# Patient Record
Sex: Male | Born: 1950
Health system: Southern US, Community
[De-identification: ages and names within clinical notes are randomized; demographics above are authoritative.]

## PROBLEM LIST (undated history)

## (undated) DIAGNOSIS — F1921 Other psychoactive substance dependence, in remission: Secondary | ICD-10-CM

## (undated) DIAGNOSIS — I10 Essential (primary) hypertension: Secondary | ICD-10-CM

## (undated) DIAGNOSIS — N185 Chronic kidney disease, stage 5: Secondary | ICD-10-CM

## (undated) DIAGNOSIS — E119 Type 2 diabetes mellitus without complications: Secondary | ICD-10-CM

## (undated) DIAGNOSIS — I639 Cerebral infarction, unspecified: Secondary | ICD-10-CM

## (undated) DIAGNOSIS — I509 Heart failure, unspecified: Secondary | ICD-10-CM

## (undated) DIAGNOSIS — W3400XA Accidental discharge from unspecified firearms or gun, initial encounter: Secondary | ICD-10-CM

## (undated) DIAGNOSIS — N289 Disorder of kidney and ureter, unspecified: Secondary | ICD-10-CM

## (undated) HISTORY — PX: ABDOMINAL SURGERY: SHX537

## (undated) HISTORY — PX: CEREBRAL ANEURYSM REPAIR: SHX164

## (undated) HISTORY — PX: OTHER SURGICAL HISTORY: SHX169

---

## 1999-05-29 DIAGNOSIS — F1921 Other psychoactive substance dependence, in remission: Secondary | ICD-10-CM

## 1999-05-29 HISTORY — DX: Other psychoactive substance dependence, in remission: F19.21

## 2014-07-06 ENCOUNTER — Emergency Department (HOSPITAL_BASED_OUTPATIENT_CLINIC_OR_DEPARTMENT_OTHER)
Admission: EM | Admit: 2014-07-06 | Discharge: 2014-07-06 | Disposition: A | Discharge status: Home / Self Care | Payer: Self-pay | Attending: Emergency Medicine | Admitting: Emergency Medicine

## 2014-07-06 ENCOUNTER — Encounter (HOSPITAL_BASED_OUTPATIENT_CLINIC_OR_DEPARTMENT_OTHER): Payer: Self-pay | Admitting: *Deleted

## 2014-07-06 DIAGNOSIS — Z87828 Personal history of other (healed) physical injury and trauma: Secondary | ICD-10-CM | POA: Insufficient documentation

## 2014-07-06 DIAGNOSIS — I10 Essential (primary) hypertension: Secondary | ICD-10-CM | POA: Insufficient documentation

## 2014-07-06 DIAGNOSIS — Z720 Tobacco use: Secondary | ICD-10-CM | POA: Insufficient documentation

## 2014-07-06 DIAGNOSIS — R040 Epistaxis: Secondary | ICD-10-CM | POA: Insufficient documentation

## 2014-07-06 HISTORY — DX: Essential (primary) hypertension: I10

## 2014-07-06 HISTORY — DX: Accidental discharge from unspecified firearms or gun, initial encounter: W34.00XA

## 2014-07-06 LAB — BASIC METABOLIC PANEL
Anion gap: 4 — ABNORMAL LOW (ref 5–15)
BUN: 18 mg/dL (ref 6–23)
CO2: 27 mmol/L (ref 19–32)
Calcium: 8.7 mg/dL (ref 8.4–10.5)
Chloride: 105 mmol/L (ref 96–112)
Creatinine, Ser: 1.06 mg/dL (ref 0.50–1.35)
GFR calc Af Amer: 85 mL/min — ABNORMAL LOW (ref >90)
GFR calc non Af Amer: 73 mL/min — ABNORMAL LOW (ref >90)
Glucose, Bld: 94 mg/dL (ref 70–99)
Potassium: 3.7 mmol/L (ref 3.5–5.1)
Sodium: 136 mmol/L (ref 135–145)

## 2014-07-06 MED ORDER — CLONIDINE HCL 0.1 MG PO TABS
0.1000 mg | ORAL_TABLET | Freq: Every day | ORAL | Status: DC
  Administered 2014-07-06: 0.1 mg via ORAL
  Filled 2014-07-06: qty 1

## 2014-07-06 MED ORDER — ATENOLOL 25 MG PO TABS: 25.0000 mg | ORAL_TABLET | Freq: Every day | ORAL | Status: DC

## 2014-07-06 MED ORDER — LISINOPRIL 20 MG PO TABS: 20.0000 mg | ORAL_TABLET | Freq: Every day | ORAL | Status: DC
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
## 2014-07-06 NOTE — ED Notes (Addendum)
Pt c/o nose bleed x 12 hrs no bleeding noted at this time. Denies h/a states he is not taking his  BP med

## 2014-07-06 NOTE — Discharge Instructions (Signed)
Nosebleed Nosebleeds can be caused by many conditions, including trauma, infections, polyps, foreign bodies, dry mucous membranes or climate, medicines, and air conditioning. Most nosebleeds occur in the front of the nose. Because of this location, most nosebleeds can be controlled by pinching the nostrils gently and continuously for at least 10 to 20 minutes. The long, continuous pressure allows enough time for the blood to clot. If pressure is released during that 10 to 20 minute time period, the process may have to be started again. The nosebleed may stop by itself or quit with pressure, or it may need concentrated heating (cautery) or pressure from packing. HOME CARE INSTRUCTIONS   If your nose was packed, try to maintain the pack inside until your health care provider removes it. If a gauze pack was used and it starts to fall out, gently replace it or cut the end off. Do not cut if a balloon catheter was used to pack the nose. Otherwise, do not remove unless instructed.  Avoid blowing your nose for 12 hours after treatment. This could dislodge the pack or clot and start the bleeding again.  If the bleeding starts again, sit up and bend forward, gently pinching the front half of your nose continuously for 20 minutes.  If bleeding was caused by dry mucous membranes, use over-the-counter saline nasal spray or gel. This will keep the mucous membranes moist and allow them to heal. If you must use a lubricant, choose the water-soluble variety. Use it only sparingly and not within several hours of lying down.  Do not use petroleum jelly or mineral oil, as these may drip into the lungs and cause serious problems.  Maintain humidity in your home by using less air conditioning or by using a humidifier.  Do not use aspirin or medicines which make bleeding more likely. Your health care provider can give you recommendations on this.  Resume normal activities as you are able, but try to avoid straining,  lifting, or bending at the waist for several days.  If the nosebleeds become recurrent and the cause is unknown, your health care provider may suggest laboratory tests. SEEK MEDICAL CARE IF: You have a fever. SEEK IMMEDIATE MEDICAL CARE IF:   Bleeding recurs and cannot be controlled.  There is unusual bleeding from or bruising on other parts of the body.  Nosebleeds continue.  There is any worsening of the condition which originally brought you in.  You become light-headed, feel faint, become sweaty, or vomit blood. MAKE SURE YOU:   Understand these instructions.  Will watch your condition.  Will get help right away if you are not doing well or get worse. Document Released: 02/21/2005 Document Revised: 09/28/2013 Document Reviewed: 04/14/2009 [facility] Patient Information 2015 [place], [place]. This information is not intended to replace advice given to you by your health care provider. Make sure you discuss any questions you have with your health care provider. Hypertension Hypertension, commonly called high blood pressure, is when the force of blood pumping through your arteries is too strong. Your arteries are the blood vessels that carry blood from your heart throughout your body. A blood pressure reading consists of a higher number over a lower number, such as 110/72. The higher number (systolic) is the pressure inside your arteries when your heart pumps. The lower number (diastolic) is the pressure inside your arteries when your heart relaxes. Ideally you want your blood pressure below 120/80. Hypertension forces your heart to work harder to pump blood. Your arteries may become  narrow or stiff. Having hypertension puts you at risk for heart disease, stroke, and other problems.  RISK FACTORS Some risk factors for high blood pressure are controllable. Others are not.  Risk factors you cannot control include:   Race. You may be at higher risk if you are African American.  Age. Risk  increases with age.  Gender. Men are at higher risk than women before age 37 years. After age 55, women are at higher risk than men. Risk factors you can control include:  Not getting enough exercise or physical activity.  Being overweight.  Getting too much fat, sugar, calories, or salt in your diet.  Drinking too much alcohol. SIGNS AND SYMPTOMS Hypertension does not usually cause signs or symptoms. Extremely high blood pressure (hypertensive crisis) may cause headache, anxiety, shortness of breath, and nosebleed. DIAGNOSIS  To check if you have hypertension, your health care provider will measure your blood pressure while you are seated, with your arm held at the level of your heart. It should be measured at least twice using the same arm. Certain conditions can cause a difference in blood pressure between your right and left arms. A blood pressure reading that is higher than normal on one occasion does not mean that you need treatment. If one blood pressure reading is high, ask your health care provider about having it checked again. TREATMENT  Treating high blood pressure includes making lifestyle changes and possibly taking medicine. Living a healthy lifestyle can help lower high blood pressure. You may need to change some of your habits. Lifestyle changes may include:  Following the DASH diet. This diet is high in fruits, vegetables, and whole grains. It is low in salt, red meat, and added sugars.  Getting at least 2 hours of brisk physical activity every week.  Losing weight if necessary.  Not smoking.  Limiting alcoholic beverages.  Learning ways to reduce stress. If lifestyle changes are not enough to get your blood pressure under control, your health care provider may prescribe medicine. You may need to take more than one. Work closely with your health care provider to understand the risks and benefits. HOME CARE INSTRUCTIONS  Have your blood pressure rechecked as  directed by your health care provider.   Take medicines only as directed by your health care provider. Follow the directions carefully. Blood pressure medicines must be taken as prescribed. The medicine does not work as well when you skip doses. Skipping doses also puts you at risk for problems.   Do not smoke.   Monitor your blood pressure at home as directed by your health care provider. SEEK MEDICAL CARE IF:   You think you are having a reaction to medicines taken.  You have recurrent headaches or feel dizzy.  You have swelling in your ankles.  You have trouble with your vision. SEEK IMMEDIATE MEDICAL CARE IF:  You develop a severe headache or confusion.  You have unusual weakness, numbness, or feel faint.  You have severe chest or abdominal pain.  You vomit repeatedly.  You have trouble breathing. MAKE SURE YOU:   Understand these instructions.  Will watch your condition.  Will get help right away if you are not doing well or get worse. Document Released: 05/14/2005 Document Revised: 09/28/2013 Document Reviewed: 03/06/2013 [facility] Patient Information 2015 [place], [place]. This information is not intended to replace advice given to you by your health care provider. Make sure you discuss any questions you have with your health care provider.

## 2016-01-07 ENCOUNTER — Emergency Department (HOSPITAL_BASED_OUTPATIENT_CLINIC_OR_DEPARTMENT_OTHER)
Admission: EM | Admit: 2016-01-07 | Discharge: 2016-01-07 | Disposition: A | Discharge status: Home / Self Care | Payer: No Typology Code available for payment source | Attending: Emergency Medicine | Admitting: Emergency Medicine

## 2016-01-07 ENCOUNTER — Encounter (HOSPITAL_BASED_OUTPATIENT_CLINIC_OR_DEPARTMENT_OTHER): Payer: Self-pay | Admitting: *Deleted

## 2016-01-07 DIAGNOSIS — I10 Essential (primary) hypertension: Secondary | ICD-10-CM | POA: Diagnosis not present

## 2016-01-07 DIAGNOSIS — Y939 Activity, unspecified: Secondary | ICD-10-CM | POA: Insufficient documentation

## 2016-01-07 DIAGNOSIS — F17210 Nicotine dependence, cigarettes, uncomplicated: Secondary | ICD-10-CM | POA: Insufficient documentation

## 2016-01-07 DIAGNOSIS — S39012A Strain of muscle, fascia and tendon of lower back, initial encounter: Secondary | ICD-10-CM | POA: Insufficient documentation

## 2016-01-07 DIAGNOSIS — Y999 Unspecified external cause status: Secondary | ICD-10-CM | POA: Insufficient documentation

## 2016-01-07 DIAGNOSIS — Y92410 Unspecified street and highway as the place of occurrence of the external cause: Secondary | ICD-10-CM | POA: Insufficient documentation

## 2016-01-07 DIAGNOSIS — T148XXA Other injury of unspecified body region, initial encounter: Secondary | ICD-10-CM

## 2016-01-07 DIAGNOSIS — M545 Low back pain: Secondary | ICD-10-CM | POA: Diagnosis present

## 2016-01-07 HISTORY — DX: Other psychoactive substance dependence, in remission: F19.21

## 2016-01-07 MED ORDER — METHOCARBAMOL 500 MG PO TABS: 500.0000 mg | ORAL_TABLET | Freq: Two times a day (BID) | ORAL | 0 refills | Status: DC

## 2016-01-07 MED ORDER — AMLODIPINE BESYLATE 5 MG PO TABS: 5.0000 mg | ORAL_TABLET | Freq: Every day | ORAL | 0 refills | Status: DC

## 2016-01-07 MED ORDER — IBUPROFEN 400 MG PO TABS
400.0000 mg | ORAL_TABLET | Freq: Once | ORAL | Status: AC
  Administered 2016-01-07: 400 mg via ORAL
  Filled 2016-01-07: qty 1

## 2016-01-07 MED ORDER — AMLODIPINE BESYLATE 5 MG PO TABS
10.0000 mg | ORAL_TABLET | Freq: Once | ORAL | Status: AC
  Administered 2016-01-07: 10 mg via ORAL
  Filled 2016-01-07: qty 2

## 2016-01-07 MED ORDER — METHOCARBAMOL 500 MG PO TABS
1000.0000 mg | ORAL_TABLET | Freq: Once | ORAL | Status: AC
Start: 2016-01-07 — End: 2016-01-07
  Administered 2016-01-07: 1000 mg via ORAL
  Filled 2016-01-07: qty 2
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
## 2016-01-07 NOTE — Discharge Instructions (Signed)
You can also take  tylenol (acetaminophen) 975mg  (this is 3 over the counter pills) four times a day. Do not drink alcohol or combine with other medications that have acetaminophen as an ingredient (Read the labels!).    For breakthrough pain you may take Robaxin. Do not drink alcohol, drive or operate heavy machinery when taking Robaxin.  Do not hesitate to return to the emergency room for any new, worsening or concerning symptoms.  Please obtain primary care using resource guide below. Let them know that you were seen in the emergency room and that they will need to obtain records for further outpatient management.
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
## 2016-01-07 NOTE — ED Notes (Signed)
Went to discharge pt and pt wasn't in his room. Checked waiting room and parking lot and didn't see pt.

## 2016-01-07 NOTE — ED Notes (Signed)
PA-C at bedside 

## 2016-01-07 NOTE — ED Notes (Signed)
Attempted to call pt -- number on file isn't currently in service.

## 2019-05-18 ENCOUNTER — Emergency Department (HOSPITAL_BASED_OUTPATIENT_CLINIC_OR_DEPARTMENT_OTHER): Payer: Medicare Other

## 2019-05-18 ENCOUNTER — Encounter (HOSPITAL_BASED_OUTPATIENT_CLINIC_OR_DEPARTMENT_OTHER): Payer: Self-pay | Admitting: Emergency Medicine

## 2019-05-18 ENCOUNTER — Other Ambulatory Visit: Payer: Self-pay

## 2019-05-18 ENCOUNTER — Emergency Department (HOSPITAL_BASED_OUTPATIENT_CLINIC_OR_DEPARTMENT_OTHER)
Admission: EM | Admit: 2019-05-18 | Discharge: 2019-05-18 | Disposition: A | Discharge status: Home / Self Care | Payer: Medicare Other | Attending: Emergency Medicine | Admitting: Emergency Medicine

## 2019-05-18 DIAGNOSIS — U071 COVID-19: Secondary | ICD-10-CM | POA: Insufficient documentation

## 2019-05-18 DIAGNOSIS — I129 Hypertensive chronic kidney disease with stage 1 through stage 4 chronic kidney disease, or unspecified chronic kidney disease: Secondary | ICD-10-CM | POA: Diagnosis not present

## 2019-05-18 DIAGNOSIS — Z79899 Other long term (current) drug therapy: Secondary | ICD-10-CM | POA: Diagnosis not present

## 2019-05-18 DIAGNOSIS — F17210 Nicotine dependence, cigarettes, uncomplicated: Secondary | ICD-10-CM | POA: Diagnosis not present

## 2019-05-18 DIAGNOSIS — R0602 Shortness of breath: Secondary | ICD-10-CM

## 2019-05-18 DIAGNOSIS — A09 Infectious gastroenteritis and colitis, unspecified: Secondary | ICD-10-CM

## 2019-05-18 DIAGNOSIS — N189 Chronic kidney disease, unspecified: Secondary | ICD-10-CM | POA: Diagnosis not present

## 2019-05-18 LAB — URINALYSIS, ROUTINE W REFLEX MICROSCOPIC
Glucose, UA: NEGATIVE mg/dL
Ketones, ur: NEGATIVE mg/dL
Leukocytes,Ua: NEGATIVE
Nitrite: NEGATIVE
Protein, ur: 100 mg/dL — AB
Specific Gravity, Urine: 1.02 (ref 1.005–1.030)
pH: 6 (ref 5.0–8.0)

## 2019-05-18 LAB — CBC WITH DIFFERENTIAL/PLATELET
Abs Immature Granulocytes: 0.13 10*3/uL — ABNORMAL HIGH (ref 0.00–0.07)
Basophils Absolute: 0 10*3/uL (ref 0.0–0.1)
Basophils Relative: 0 %
Eosinophils Absolute: 0 10*3/uL (ref 0.0–0.5)
Eosinophils Relative: 0 %
HCT: 45.5 % (ref 39.0–52.0)
Hemoglobin: 14.6 g/dL (ref 13.0–17.0)
Immature Granulocytes: 1 %
Lymphocytes Relative: 18 %
Lymphs Abs: 1.8 10*3/uL (ref 0.7–4.0)
MCH: 31.9 pg (ref 26.0–34.0)
MCHC: 32.1 g/dL (ref 30.0–36.0)
MCV: 99.3 fL (ref 80.0–100.0)
Monocytes Absolute: 0.7 10*3/uL (ref 0.1–1.0)
Monocytes Relative: 7 %
Neutro Abs: 7.4 10*3/uL (ref 1.7–7.7)
Neutrophils Relative %: 74 %
Platelets: 239 10*3/uL (ref 150–400)
RBC: 4.58 MIL/uL (ref 4.22–5.81)
RDW: 11.8 % (ref 11.5–15.5)
WBC: 10 10*3/uL (ref 4.0–10.5)
nRBC: 0 % (ref 0.0–0.2)

## 2019-05-18 LAB — COMPREHENSIVE METABOLIC PANEL
ALT: 30 U/L (ref 0–44)
AST: 41 U/L (ref 15–41)
Albumin: 3.5 g/dL (ref 3.5–5.0)
Alkaline Phosphatase: 77 U/L (ref 38–126)
Anion gap: 10 (ref 5–15)
BUN: 43 mg/dL — ABNORMAL HIGH (ref 8–23)
CO2: 23 mmol/L (ref 22–32)
Calcium: 9.4 mg/dL (ref 8.9–10.3)
Chloride: 111 mmol/L (ref 98–111)
Creatinine, Ser: 2.93 mg/dL — ABNORMAL HIGH (ref 0.61–1.24)
GFR calc Af Amer: 24 mL/min — ABNORMAL LOW (ref >60)
GFR calc non Af Amer: 21 mL/min — ABNORMAL LOW (ref >60)
Glucose, Bld: 102 mg/dL — ABNORMAL HIGH (ref 70–99)
Potassium: 4.3 mmol/L (ref 3.5–5.1)
Sodium: 144 mmol/L (ref 135–145)
Total Bilirubin: 2.3 mg/dL — ABNORMAL HIGH (ref 0.3–1.2)
Total Protein: 8 g/dL (ref 6.5–8.1)

## 2019-05-18 LAB — URINALYSIS, MICROSCOPIC (REFLEX)

## 2019-05-18 MED ORDER — BENZONATATE 100 MG PO CAPS: 100.0000 mg | ORAL_CAPSULE | Freq: Three times a day (TID) | ORAL | 0 refills | Status: DC

## 2019-05-18 MED ORDER — SODIUM CHLORIDE 0.9 % IV BOLUS
500.0000 mL | Freq: Once | INTRAVENOUS | Status: AC
  Administered 2019-05-18: 500 mL via INTRAVENOUS

## 2019-05-18 MED FILL — BENZONATATE 100 MG CAP: 100 | 7 days supply | Qty: 21 | Fill #0

## 2019-05-18 NOTE — ED Triage Notes (Signed)
Pt seen at [facility] on 12/18 for sore throat and sob.  Pt states he still is sob.  Pt denies fever.  sats on ra 97-98%  No acute resp distress noted.
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
## 2019-05-20 LAB — URINE CULTURE: Culture: 30000 — AB

## 2019-05-21 ENCOUNTER — Telehealth: Payer: Self-pay

## 2019-05-21 NOTE — Telephone Encounter (Deleted)
[facility]  Ed 12/19/220 report faxed to [facility] [place] [phone] (fax number)

## 2019-05-21 NOTE — Progress Notes (Signed)
ED Antimicrobial Stewardship Positive Culture Follow Up   Samuel [name] is an 68 y.o. male who presented to [facility] on 05/18/2019 with a chief complaint of  Chief Complaint  Patient presents with  . Shortness of Breath    positive covid 05/15/19    Recent Results (from the past 720 hour(s))  Urine culture     Status: Abnormal   Collection Time: 05/18/19 12:45 PM   Specimen: Urine, Clean Catch  Result Value Ref Range Status   Specimen Description   Final    URINE, CLEAN CATCH Performed at [facility], [address] [place], [place] [address]    Special Requests   Final    NONE Performed at [facility], [address] [place], [place] [address]    Culture 30,000 COLONIES/mL ENTEROBACTER AEROGENES (A)  Final   Report Status 05/20/2019 FINAL  Final   Organism ID, Bacteria ENTEROBACTER AEROGENES (A)  Final      Susceptibility   Enterobacter aerogenes - MIC*    CEFAZOLIN RESISTANT Resistant     CEFTRIAXONE <=1 SENSITIVE Sensitive     CIPROFLOXACIN <=0.25 SENSITIVE Sensitive     GENTAMICIN <=1 SENSITIVE Sensitive     IMIPENEM <=0.25 SENSITIVE Sensitive     NITROFURANTOIN <=16 SENSITIVE Sensitive     TRIMETH/SULFA <=20 SENSITIVE Sensitive     PIP/TAZO <=4 SENSITIVE Sensitive     * 30,000 COLONIES/mL ENTEROBACTER AEROGENES    No wellness check or treatment given no urinary symptoms and negative UA (neg nitrite/leukocytes, WBC 0-5).  ED Provider: Pati Gallo, PA   [facility] 05/21/2019, 9:32 AM Clinical Pharmacist Monday - Friday phone -  [phone] Saturday - Sunday phone - [phone]

## 2019-05-21 NOTE — Telephone Encounter (Signed)
Post ED Visit - Positive Culture Follow-up: Unsuccessful Patient Follow-up  Culture assessed and recommendations reviewed by:  []  Elenor Quinones, Pharm.D. []  Heide Guile, Pharm.D., BCPS AQ-ID []  Parks Neptune, Pharm.D., BCPS []  Alycia Rossetti, Pharm.D., BCPS []  [place], Pharm.D., BCPS, AAHIVP []  Legrand Como, Pharm.D., BCPS, AAHIVP []  Wynell Balloon, PharmD []  Vincenza Hews, PharmD, BCPS Gorden Harms Pharm D Positive urine culture symptom check []  Patient discharged without antimicrobial prescription and treatment is now indicated []  Organism is resistant to prescribed ED discharge antimicrobial []  Patient with positive blood cultures   Unable to contact patient after 3 attempts, letter will be sent to address on file  Genia Del 05/21/2019, 9:44 AM

## 2020-07-15 ENCOUNTER — Inpatient Hospital Stay (HOSPITAL_BASED_OUTPATIENT_CLINIC_OR_DEPARTMENT_OTHER)
Admission: EM | Admit: 2020-07-15 | Discharge: 2020-07-22 | DRG: 065 | Disposition: A | Discharge status: Skilled Nursing Facility | Payer: Medicare HMO | Attending: Neurology | Admitting: Neurology

## 2020-07-15 ENCOUNTER — Encounter (HOSPITAL_BASED_OUTPATIENT_CLINIC_OR_DEPARTMENT_OTHER): Payer: Self-pay | Admitting: Emergency Medicine

## 2020-07-15 ENCOUNTER — Other Ambulatory Visit: Payer: Self-pay

## 2020-07-15 DIAGNOSIS — I610 Nontraumatic intracerebral hemorrhage in hemisphere, subcortical: Principal | ICD-10-CM | POA: Diagnosis present

## 2020-07-15 DIAGNOSIS — R270 Ataxia, unspecified: Secondary | ICD-10-CM | POA: Diagnosis present

## 2020-07-15 DIAGNOSIS — I459 Conduction disorder, unspecified: Secondary | ICD-10-CM | POA: Diagnosis present

## 2020-07-15 DIAGNOSIS — R29810 Facial weakness: Secondary | ICD-10-CM | POA: Diagnosis present

## 2020-07-15 DIAGNOSIS — R29703 NIHSS score 3: Secondary | ICD-10-CM | POA: Diagnosis present

## 2020-07-15 DIAGNOSIS — I619 Nontraumatic intracerebral hemorrhage, unspecified: Secondary | ICD-10-CM

## 2020-07-15 DIAGNOSIS — Z79899 Other long term (current) drug therapy: Secondary | ICD-10-CM

## 2020-07-15 DIAGNOSIS — N184 Chronic kidney disease, stage 4 (severe): Secondary | ICD-10-CM

## 2020-07-15 DIAGNOSIS — I129 Hypertensive chronic kidney disease with stage 1 through stage 4 chronic kidney disease, or unspecified chronic kidney disease: Secondary | ICD-10-CM | POA: Diagnosis present

## 2020-07-15 DIAGNOSIS — I161 Hypertensive emergency: Secondary | ICD-10-CM | POA: Diagnosis present

## 2020-07-15 DIAGNOSIS — N179 Acute kidney failure, unspecified: Secondary | ICD-10-CM | POA: Diagnosis present

## 2020-07-15 DIAGNOSIS — I169 Hypertensive crisis, unspecified: Secondary | ICD-10-CM

## 2020-07-15 DIAGNOSIS — Z20822 Contact with and (suspected) exposure to covid-19: Secondary | ICD-10-CM | POA: Diagnosis present

## 2020-07-15 DIAGNOSIS — F17210 Nicotine dependence, cigarettes, uncomplicated: Secondary | ICD-10-CM | POA: Diagnosis present

## 2020-07-15 DIAGNOSIS — G8194 Hemiplegia, unspecified affecting left nondominant side: Secondary | ICD-10-CM | POA: Diagnosis present

## 2020-07-15 NOTE — ED Triage Notes (Signed)
Pt c/o left sided weakness onset when awaking from nap at 1800.

## 2020-07-16 ENCOUNTER — Encounter (HOSPITAL_BASED_OUTPATIENT_CLINIC_OR_DEPARTMENT_OTHER): Payer: Self-pay | Admitting: Emergency Medicine

## 2020-07-16 ENCOUNTER — Inpatient Hospital Stay (HOSPITAL_COMMUNITY): Payer: Medicare HMO

## 2020-07-16 ENCOUNTER — Emergency Department (HOSPITAL_BASED_OUTPATIENT_CLINIC_OR_DEPARTMENT_OTHER): Payer: Medicare HMO

## 2020-07-16 DIAGNOSIS — I161 Hypertensive emergency: Secondary | ICD-10-CM | POA: Diagnosis present

## 2020-07-16 DIAGNOSIS — N184 Chronic kidney disease, stage 4 (severe): Secondary | ICD-10-CM

## 2020-07-16 DIAGNOSIS — F17200 Nicotine dependence, unspecified, uncomplicated: Secondary | ICD-10-CM | POA: Diagnosis not present

## 2020-07-16 DIAGNOSIS — I610 Nontraumatic intracerebral hemorrhage in hemisphere, subcortical: Secondary | ICD-10-CM | POA: Diagnosis present

## 2020-07-16 DIAGNOSIS — I6389 Other cerebral infarction: Secondary | ICD-10-CM

## 2020-07-16 DIAGNOSIS — I459 Conduction disorder, unspecified: Secondary | ICD-10-CM | POA: Diagnosis present

## 2020-07-16 DIAGNOSIS — R29703 NIHSS score 3: Secondary | ICD-10-CM | POA: Diagnosis present

## 2020-07-16 DIAGNOSIS — N179 Acute kidney failure, unspecified: Secondary | ICD-10-CM | POA: Diagnosis present

## 2020-07-16 DIAGNOSIS — G8194 Hemiplegia, unspecified affecting left nondominant side: Secondary | ICD-10-CM | POA: Diagnosis present

## 2020-07-16 DIAGNOSIS — I169 Hypertensive crisis, unspecified: Secondary | ICD-10-CM

## 2020-07-16 DIAGNOSIS — I129 Hypertensive chronic kidney disease with stage 1 through stage 4 chronic kidney disease, or unspecified chronic kidney disease: Secondary | ICD-10-CM | POA: Diagnosis present

## 2020-07-16 DIAGNOSIS — F17210 Nicotine dependence, cigarettes, uncomplicated: Secondary | ICD-10-CM | POA: Diagnosis present

## 2020-07-16 DIAGNOSIS — Z20822 Contact with and (suspected) exposure to covid-19: Secondary | ICD-10-CM | POA: Diagnosis present

## 2020-07-16 DIAGNOSIS — R270 Ataxia, unspecified: Secondary | ICD-10-CM | POA: Diagnosis present

## 2020-07-16 DIAGNOSIS — Z79899 Other long term (current) drug therapy: Secondary | ICD-10-CM | POA: Diagnosis not present

## 2020-07-16 DIAGNOSIS — I619 Nontraumatic intracerebral hemorrhage, unspecified: Secondary | ICD-10-CM | POA: Diagnosis present

## 2020-07-16 DIAGNOSIS — R29810 Facial weakness: Secondary | ICD-10-CM | POA: Diagnosis present

## 2020-07-16 LAB — DIFFERENTIAL
Abs Immature Granulocytes: 0.02 10*3/uL (ref 0.00–0.07)
Basophils Absolute: 0 10*3/uL (ref 0.0–0.1)
Basophils Relative: 1 %
Eosinophils Absolute: 0.3 10*3/uL (ref 0.0–0.5)
Eosinophils Relative: 4 %
Immature Granulocytes: 0 %
Lymphocytes Relative: 39 %
Lymphs Abs: 3.3 10*3/uL (ref 0.7–4.0)
Monocytes Absolute: 0.7 10*3/uL (ref 0.1–1.0)
Monocytes Relative: 8 %
Neutro Abs: 4.2 10*3/uL (ref 1.7–7.7)
Neutrophils Relative %: 48 %

## 2020-07-16 LAB — ECHOCARDIOGRAM COMPLETE
AR max vel: 1.68 cm2
AV Area VTI: 1.82 cm2
AV Area mean vel: 1.7 cm2
AV Mean grad: 8 mmHg
AV Peak grad: 16.2 mmHg
Ao pk vel: 2.01 m/s
Area-P 1/2: 2.66 cm2
Height: 77 in
P 1/2 time: 679 msec
S' Lateral: 3.7 cm
Weight: 3333.36 oz

## 2020-07-16 LAB — URINALYSIS, ROUTINE W REFLEX MICROSCOPIC
Bilirubin Urine: NEGATIVE
Glucose, UA: NEGATIVE mg/dL
Hgb urine dipstick: NEGATIVE
Ketones, ur: NEGATIVE mg/dL
Leukocytes,Ua: NEGATIVE
Nitrite: NEGATIVE
Protein, ur: NEGATIVE mg/dL
Specific Gravity, Urine: 1.015 (ref 1.005–1.030)
pH: 8 (ref 5.0–8.0)

## 2020-07-16 LAB — COMPREHENSIVE METABOLIC PANEL
ALT: 28 U/L (ref 0–44)
AST: 32 U/L (ref 15–41)
Albumin: 3.5 g/dL (ref 3.5–5.0)
Alkaline Phosphatase: 68 U/L (ref 38–126)
Anion gap: 11 (ref 5–15)
BUN: 51 mg/dL — ABNORMAL HIGH (ref 8–23)
CO2: 22 mmol/L (ref 22–32)
Calcium: 8.4 mg/dL — ABNORMAL LOW (ref 8.9–10.3)
Chloride: 109 mmol/L (ref 98–111)
Creatinine, Ser: 4.01 mg/dL — ABNORMAL HIGH (ref 0.61–1.24)
GFR, Estimated: 15 mL/min — ABNORMAL LOW (ref >60)
Glucose, Bld: 95 mg/dL (ref 70–99)
Potassium: 4.1 mmol/L (ref 3.5–5.1)
Sodium: 142 mmol/L (ref 135–145)
Total Bilirubin: 0.7 mg/dL (ref 0.3–1.2)
Total Protein: 7.2 g/dL (ref 6.5–8.1)

## 2020-07-16 LAB — RESP PANEL BY RT-PCR (FLU A&B, COVID) ARPGX2
Influenza A by PCR: NEGATIVE
Influenza B by PCR: NEGATIVE
SARS Coronavirus 2 by RT PCR: NEGATIVE

## 2020-07-16 LAB — CBC
HCT: 37 % — ABNORMAL LOW (ref 39.0–52.0)
Hemoglobin: 12.6 g/dL — ABNORMAL LOW (ref 13.0–17.0)
MCH: 33.3 pg (ref 26.0–34.0)
MCHC: 34.1 g/dL (ref 30.0–36.0)
MCV: 97.9 fL (ref 80.0–100.0)
Platelets: 213 10*3/uL (ref 150–400)
RBC: 3.78 MIL/uL — ABNORMAL LOW (ref 4.22–5.81)
RDW: 12 % (ref 11.5–15.5)
WBC: 8.6 10*3/uL (ref 4.0–10.5)
nRBC: 0 % (ref 0.0–0.2)

## 2020-07-16 LAB — PROTIME-INR
INR: 1 (ref 0.8–1.2)
Prothrombin Time: 12.9 seconds (ref 11.4–15.2)

## 2020-07-16 LAB — RAPID URINE DRUG SCREEN, HOSP PERFORMED
Amphetamines: NOT DETECTED
Barbiturates: NOT DETECTED
Benzodiazepines: NOT DETECTED
Cocaine: NOT DETECTED
Opiates: NOT DETECTED
Tetrahydrocannabinol: NOT DETECTED

## 2020-07-16 LAB — HIV ANTIBODY (ROUTINE TESTING W REFLEX): HIV Screen 4th Generation wRfx: NONREACTIVE

## 2020-07-16 LAB — MRSA PCR SCREENING: MRSA by PCR: NEGATIVE

## 2020-07-16 LAB — APTT: aPTT: 38 seconds — ABNORMAL HIGH (ref 24–36)

## 2020-07-16 LAB — ETHANOL: Alcohol, Ethyl (B): <10 mg/dL (ref <10)

## 2020-07-16 MED ORDER — STROKE: EARLY STAGES OF RECOVERY BOOK
Freq: Once | Status: AC
  Filled 2020-07-16: qty 1

## 2020-07-16 MED ORDER — SODIUM CHLORIDE 0.9 % IV SOLN
INTRAVENOUS | Status: DC | PRN
  Administered 2020-07-16: 250 mL via INTRAVENOUS

## 2020-07-16 MED ORDER — AMLODIPINE BESYLATE 10 MG PO TABS
10.0000 mg | ORAL_TABLET | Freq: Every day | ORAL | Status: DC
  Administered 2020-07-16 – 2020-07-22 (×7): 10 mg via ORAL
  Filled 2020-07-16 (×7): qty 1

## 2020-07-16 MED ORDER — LABETALOL HCL 5 MG/ML IV SOLN
10.0000 mg | INTRAVENOUS | Status: DC | PRN
  Administered 2020-07-16 (×2): 10 mg via INTRAVENOUS
  Filled 2020-07-16 (×2): qty 4

## 2020-07-16 MED ORDER — LABETALOL HCL 100 MG PO TABS
300.0000 mg | ORAL_TABLET | Freq: Two times a day (BID) | ORAL | Status: DC
  Administered 2020-07-16: 300 mg via ORAL
  Filled 2020-07-16: qty 3

## 2020-07-16 MED ORDER — NICARDIPINE HCL IN NACL 20-0.86 MG/200ML-% IV SOLN
0.0000 mg/h | INTRAVENOUS | Status: DC
  Administered 2020-07-16: 10 mg/h via INTRAVENOUS
  Administered 2020-07-16: 11 mg/h via INTRAVENOUS
  Filled 2020-07-16 (×4): qty 200

## 2020-07-16 MED ORDER — ACETAMINOPHEN 325 MG PO TABS: 650.0000 mg | ORAL_TABLET | ORAL | Status: DC | PRN

## 2020-07-16 MED ORDER — PANTOPRAZOLE SODIUM 40 MG PO TBEC
40.0000 mg | DELAYED_RELEASE_TABLET | Freq: Every day | ORAL | Status: DC
  Administered 2020-07-16 – 2020-07-22 (×6): 40 mg via ORAL
  Filled 2020-07-16 (×7): qty 1

## 2020-07-16 MED ORDER — CHLORHEXIDINE GLUCONATE CLOTH 2 % EX PADS
6.0000 | MEDICATED_PAD | Freq: Every day | CUTANEOUS | Status: DC
  Administered 2020-07-16 – 2020-07-19 (×3): 6 via TOPICAL

## 2020-07-16 MED ORDER — PANTOPRAZOLE SODIUM 40 MG IV SOLR: 40.0000 mg | Freq: Every day | INTRAVENOUS | Status: DC

## 2020-07-16 MED ORDER — LABETALOL HCL 5 MG/ML IV SOLN
20.0000 mg | Freq: Once | INTRAVENOUS | Status: AC
  Administered 2020-07-16: 20 mg via INTRAVENOUS
  Filled 2020-07-16: qty 4

## 2020-07-16 MED ORDER — CLEVIDIPINE BUTYRATE 0.5 MG/ML IV EMUL
0.0000 mg/h | INTRAVENOUS | Status: DC
  Administered 2020-07-16: 16 mg/h via INTRAVENOUS
  Administered 2020-07-16: 14 mg/h via INTRAVENOUS
  Administered 2020-07-16: 9 mg/h via INTRAVENOUS
  Administered 2020-07-16: 14 mg/h via INTRAVENOUS
  Administered 2020-07-16: 15 mg/h via INTRAVENOUS
  Administered 2020-07-16: 2 mg/h via INTRAVENOUS
  Filled 2020-07-16: qty 50
  Filled 2020-07-16 (×2): qty 100
  Filled 2020-07-16: qty 50

## 2020-07-16 MED ORDER — HYDRALAZINE HCL 50 MG PO TABS
100.0000 mg | ORAL_TABLET | Freq: Three times a day (TID) | ORAL | Status: DC
Start: 2020-07-16 — End: 2020-07-22
  Administered 2020-07-16 – 2020-07-22 (×18): 100 mg via ORAL
  Filled 2020-07-16 (×20): qty 2

## 2020-07-16 MED ORDER — ACETAMINOPHEN 650 MG RE SUPP: 650.0000 mg | RECTAL | Status: DC | PRN

## 2020-07-16 MED ORDER — ACETAMINOPHEN 160 MG/5ML PO SOLN: 650.0000 mg | ORAL | Status: DC | PRN

## 2020-07-16 MED ORDER — LABETALOL HCL 200 MG PO TABS
300.0000 mg | ORAL_TABLET | Freq: Three times a day (TID) | ORAL | Status: DC
  Administered 2020-07-16 – 2020-07-22 (×18): 300 mg via ORAL
  Filled 2020-07-16 (×2): qty 1
  Filled 2020-07-16: qty 3
  Filled 2020-07-16 (×2): qty 1
  Filled 2020-07-16: qty 3
  Filled 2020-07-16 (×2): qty 1
  Filled 2020-07-16: qty 3
  Filled 2020-07-16 (×4): qty 1
  Filled 2020-07-16: qty 3
  Filled 2020-07-16 (×4): qty 1
  Filled 2020-07-16: qty 3
  Filled 2020-07-16: qty 1

## 2020-07-16 MED ORDER — SENNOSIDES-DOCUSATE SODIUM 8.6-50 MG PO TABS
1.0000 | ORAL_TABLET | Freq: Two times a day (BID) | ORAL | Status: DC
  Administered 2020-07-16 – 2020-07-22 (×6): 1 via ORAL
  Filled 2020-07-16 (×13): qty 1

## 2020-07-16 MED ORDER — NICARDIPINE HCL IN NACL 20-0.86 MG/200ML-% IV SOLN
INTRAVENOUS | Status: AC
  Administered 2020-07-16: 5 mg/h via INTRAVENOUS
  Filled 2020-07-16: qty 200

## 2020-07-16 NOTE — ED Notes (Signed)
Attempted to call report x 4, no answer.

## 2020-07-16 NOTE — Progress Notes (Signed)
PT Cancellation Note  Patient Details Name: Samuel [name] MRN: [id number] DOB: [birth year]   Cancelled Treatment:    Reason Eval/Treat Not Completed: Active bedrest order this morning. PT will continue to follow and evaluate when appropriate.   Karma Ganja, PT, DPT   Acute Rehabilitation Department Pager #: [phone]   Otho Bellows 07/16/2020, 7:35 AM

## 2020-07-16 NOTE — ED Notes (Signed)
pts wife at  The bedside

## 2020-07-16 NOTE — ED Notes (Signed)
Report called to rn on 4n mitchell rn

## 2020-07-16 NOTE — Progress Notes (Signed)
OT Cancellation Note  Patient Details Name: Samuel [name] MRN: [id number] DOB: 03/16/1951   Cancelled Treatment:    Reason Eval/Treat Not Completed: Active bedrest order. Will return as schedule allows.  [place], OTR/L Acute Rehab Pager: [phone] Office: [phone] 07/16/2020, 7:18 AM

## 2020-07-16 NOTE — Progress Notes (Signed)
VASCULAR LAB    Carotid duplex has been performed.  See CV proc for preliminary results.   Virgie Kunda, RVT 07/16/2020, 2:47 PM
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
## 2020-07-16 NOTE — ED Notes (Signed)
Pt transferred from [facility]  Dr Brunilda Payor this pt  His nih 3 on arrival  bp higher after the move from the stretcher than [facility] on the way here  No pain

## 2020-07-16 NOTE — ED Notes (Signed)
The pts bp cuff was removed from the rt  Upper arm because that is the arm  That has the iv med going in.. bp cuff placed onto the lt upper arm
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
## 2020-07-16 NOTE — ED Notes (Signed)
cardene  Drip rate lowered to '14mg'$ 
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
## 2020-07-16 NOTE — ED Notes (Signed)
cardene decreased to '13mg'$  iv

## 2020-07-17 LAB — CBC
HCT: 35.4 % — ABNORMAL LOW (ref 39.0–52.0)
Hemoglobin: 11.7 g/dL — ABNORMAL LOW (ref 13.0–17.0)
MCH: 32.7 pg (ref 26.0–34.0)
MCHC: 33.1 g/dL (ref 30.0–36.0)
MCV: 98.9 fL (ref 80.0–100.0)
Platelets: 198 10*3/uL (ref 150–400)
RBC: 3.58 MIL/uL — ABNORMAL LOW (ref 4.22–5.81)
RDW: 12 % (ref 11.5–15.5)
WBC: 7.8 10*3/uL (ref 4.0–10.5)
nRBC: 0 % (ref 0.0–0.2)

## 2020-07-17 LAB — HEMOGLOBIN A1C
Hgb A1c MFr Bld: 4.6 % — ABNORMAL LOW (ref 4.8–5.6)
Mean Plasma Glucose: 85.32 mg/dL

## 2020-07-17 LAB — BASIC METABOLIC PANEL
Anion gap: 12 (ref 5–15)
BUN: 42 mg/dL — ABNORMAL HIGH (ref 8–23)
CO2: 18 mmol/L — ABNORMAL LOW (ref 22–32)
Calcium: 8.4 mg/dL — ABNORMAL LOW (ref 8.9–10.3)
Chloride: 113 mmol/L — ABNORMAL HIGH (ref 98–111)
Creatinine, Ser: 4.41 mg/dL — ABNORMAL HIGH (ref 0.61–1.24)
GFR, Estimated: 14 mL/min — ABNORMAL LOW (ref >60)
Glucose, Bld: 83 mg/dL (ref 70–99)
Potassium: 3.8 mmol/L (ref 3.5–5.1)
Sodium: 143 mmol/L (ref 135–145)

## 2020-07-17 LAB — LIPID PANEL
Cholesterol: 103 mg/dL (ref 0–200)
HDL: 34 mg/dL — ABNORMAL LOW (ref >40)
LDL Cholesterol: 47 mg/dL (ref 0–99)
Total CHOL/HDL Ratio: 3 RATIO
Triglycerides: 110 mg/dL (ref <150)
VLDL: 22 mg/dL (ref 0–40)

## 2020-07-17 LAB — TRIGLYCERIDES: Triglycerides: 111 mg/dL (ref <150)

## 2020-07-17 MED ORDER — LABETALOL HCL 5 MG/ML IV SOLN
10.0000 mg | INTRAVENOUS | Status: DC | PRN
  Administered 2020-07-19: 10 mg via INTRAVENOUS
  Filled 2020-07-17: qty 4

## 2020-07-17 NOTE — Progress Notes (Signed)
Pt arrived on unit. Pt is oriented to the unit with bed locked at the lowest position with the call bell within reach

## 2020-07-17 NOTE — Progress Notes (Signed)
STROKE TEAM PROGRESS NOTE   SUBJECTIVE (INTERVAL HISTORY) His RN is at the bedside.  Overall his condition is stable. He is off cleviprex this am. SBP 140s, DBP 90-100, on home meds. PT/OT recommend CIR. Still has left hemiparesis with ataxia.    OBJECTIVE Temp:  [98 F (36.7 C)-98.6 F (37 C)] 98.1 F (36.7 C) (02/20 0800) Pulse Rate:  [50-75] 57 (02/20 1000) Cardiac Rhythm: Normal sinus rhythm (02/20 0800) Resp:  [0-24] 13 (02/20 1000) BP: (124-160)/(77-104) 137/98 (02/20 1000) SpO2:  [94 %-99 %] 96 % (02/20 1000)  No results for input(s): GLUCAP in the last 168 hours. Recent Labs  Lab 07/16/20 0008 07/17/20 0420  NA 142 143  K 4.1 3.8  CL 109 113*  CO2 22 18*  GLUCOSE 95 83  BUN 51* 42*  CREATININE 4.01* 4.41*  CALCIUM 8.4* 8.4*   Recent Labs  Lab 07/16/20 0008  AST 32  ALT 28  ALKPHOS 68  BILITOT 0.7  PROT 7.2  ALBUMIN 3.5   Recent Labs  Lab 07/16/20 0008 07/17/20 0420  WBC 8.6 7.8  NEUTROABS 4.2  --   HGB 12.6* 11.7*  HCT 37.0* 35.4*  MCV 97.9 98.9  PLT 213 198   No results for input(s): CKTOTAL, CKMB, CKMBINDEX, TROPONINI in the last 168 hours. Recent Labs    07/16/20 0008  LABPROT 12.9  INR 1.0   Recent Labs    07/16/20 0008  COLORURINE YELLOW  LABSPEC 1.015  PHURINE 8.0  GLUCOSEU NEGATIVE  HGBUR NEGATIVE  BILIRUBINUR NEGATIVE  KETONESUR NEGATIVE  PROTEINUR NEGATIVE  NITRITE NEGATIVE  LEUKOCYTESUR NEGATIVE       Component Value Date/Time   CHOL 103 07/17/2020 0420   TRIG 110 07/17/2020 0420   TRIG 111 07/17/2020 0420   HDL 34 (L) 07/17/2020 0420   CHOLHDL 3.0 07/17/2020 0420   VLDL 22 07/17/2020 0420   LDLCALC 47 07/17/2020 0420   Lab Results  Component Value Date   HGBA1C 4.6 (L) 07/17/2020      Component Value Date/Time   LABOPIA NONE DETECTED 07/16/2020 0008   COCAINSCRNUR NONE DETECTED 07/16/2020 0008   LABBENZ NONE DETECTED 07/16/2020 0008   AMPHETMU NONE DETECTED 07/16/2020 0008   THCU NONE DETECTED 07/16/2020  0008   LABBARB NONE DETECTED 07/16/2020 0008    Recent Labs  Lab 07/16/20 0008  ETH <10    I have personally reviewed the radiological images below and agree with the radiology interpretations.  CT HEAD WO CONTRAST  Result Date: 07/16/2020 CLINICAL DATA:  Follow-up intracranial hemorrhage. EXAM: CT HEAD WITHOUT CONTRAST TECHNIQUE: Contiguous axial images were obtained from the base of the skull through the vertex without intravenous contrast. COMPARISON:  07/16/2020 FINDINGS: Brain: An acute parenchymal hemorrhage involving the posterior right basal ganglia/internal capsule/lateral thalamus has not significantly changed in size, measuring 1.4 x 1.0 x 1.2 cm (estimated volume of 0.8 mL). Mild surrounding edema has slightly increased without mass effect. No acute large territory infarct, new intracranial hemorrhage, midline shift, or extra-axial fluid collection is identified. Mild-to-moderate lateral, third, and fourth ventriculomegaly is unchanged and may reflect a component of underlying chronic communicating hydrocephalus in this patient with a remote history of subarachnoid hemorrhage (versus central predominant cerebral atrophy). Patchy and confluent hypodensities in the cerebral white matter bilaterally are unchanged and nonspecific but compatible with extensive chronic small vessel ischemic disease. Vascular: Aneurysm clip in the anterior communicating region. Skull: Left perianal craniotomy. Sinuses/Orbits: Remote medial right or fracture. Mild mucosal thickening in the paranasal  sinuses. Clear mastoid air cells. Other: None. IMPRESSION: 1. Unchanged size of posterior right basal ganglia region hemorrhage with slightly increased surrounding edema. No mass effect. 2. Extensive chronic small vessel ischemic disease. 3. Unchanged ventriculomegaly. Electronically Signed   By: Logan Bores M.D.   On: 07/16/2020 16:23   ECHOCARDIOGRAM COMPLETE  Result Date: 07/16/2020    ECHOCARDIOGRAM REPORT    Patient Name:   Samuel [name] Date of Exam: 07/16/2020 Medical Rec #:  [id number]       Height:       77.0 in Accession #:    [id number]      Weight:       208.3 lb Date of Birth:  1950/06/04       BSA:          2.276 m Patient Age:    62 years        BP:           134/92 mmHg Patient Gender: M               HR:           67 bpm. Exam Location:  Inpatient Procedure: 2D Echo, Cardiac Doppler and Color Doppler Indications:    Stroke 434.91  History:        Patient has no prior history of Echocardiogram examinations.                 Risk Factors:Hypertension. Drug addiction in remission ([place])                 (From Hx), ICH (intracerebral hemorrhage).  Sonographer:    Alvino Chapel RCS Referring Phys: [phone] MCNEILL P [place]  1. There is severe left ventricular hypertrophy, consider evaluation for infiltrative cardiomyopathies such as cardiac amyloidosis.  2. Left ventricular ejection fraction, by estimation, is 60 to 65%. The left ventricle has normal function. The left ventricle has no regional wall motion abnormalities. There is severe concentric left ventricular hypertrophy. Left ventricular diastolic  parameters are consistent with Grade I diastolic dysfunction (impaired relaxation). Elevated left atrial pressure.  3. Right ventricular systolic function is normal. The right ventricular size is normal.  4. Left atrial size was moderately dilated.  5. The mitral valve is normal in structure. Mild mitral valve regurgitation. No evidence of mitral stenosis.  6. The aortic valve is normal in structure. Aortic valve regurgitation is mild. No aortic stenosis is present.  7. The inferior vena cava is normal in size with greater than 50% respiratory variability, suggesting right atrial pressure of 3 mmHg. FINDINGS  Left Ventricle: Left ventricular ejection fraction, by estimation, is 60 to 65%. The left ventricle has normal function. The left ventricle has no regional wall motion abnormalities. The left  ventricular internal cavity size was normal in size. There is  severe concentric left ventricular hypertrophy. Left ventricular diastolic parameters are consistent with Grade I diastolic dysfunction (impaired relaxation). Elevated left atrial pressure. Right Ventricle: The right ventricular size is normal. No increase in right ventricular wall thickness. Right ventricular systolic function is normal. Left Atrium: Left atrial size was moderately dilated. Right Atrium: Right atrial size was normal in size. Pericardium: There is no evidence of pericardial effusion. Mitral Valve: The mitral valve is normal in structure. Mild mitral valve regurgitation. No evidence of mitral valve stenosis. Tricuspid Valve: The tricuspid valve is normal in structure. Tricuspid valve regurgitation is mild . No evidence of tricuspid stenosis. Aortic Valve: The aortic valve is normal in structure.  Aortic valve regurgitation is mild. Aortic regurgitation PHT measures 679 msec. No aortic stenosis is present. Aortic valve mean gradient measures 8.0 mmHg. Aortic valve peak gradient measures 16.2 mmHg. Aortic valve area, by VTI measures 1.82 cm. Pulmonic Valve: The pulmonic valve was normal in structure. Pulmonic valve regurgitation is not visualized. No evidence of pulmonic stenosis. Aorta: The aortic root is normal in size and structure. Venous: The inferior vena cava is normal in size with greater than 50% respiratory variability, suggesting right atrial pressure of 3 mmHg. IAS/Shunts: No atrial level shunt detected by color flow Doppler.  LEFT VENTRICLE PLAX 2D LVIDd:         5.30 cm  Diastology LVIDs:         3.70 cm  LV e' medial:    5.00 cm/s LV PW:         1.70 cm  LV E/e' medial:  12.4 LV IVS:        1.90 cm  LV e' lateral:   7.29 cm/s LVOT diam:     2.10 cm  LV E/e' lateral: 8.5 LV SV:         73 LV SV Index:   32 LVOT Area:     3.46 cm  RIGHT VENTRICLE RV S prime:     14.00 cm/s TAPSE (M-mode): 2.4 cm LEFT ATRIUM              Index        RIGHT ATRIUM           Index LA diam:        3.50 cm  1.54 cm/m  RA Area:     25.80 cm LA Vol (A2C):   157.0 ml 68.98 ml/m RA Volume:   91.70 ml  40.29 ml/m LA Vol (A4C):   125.0 ml 54.92 ml/m LA Biplane Vol: 145.0 ml 63.71 ml/m  AORTIC VALVE AV Area (Vmax):    1.68 cm AV Area (Vmean):   1.70 cm AV Area (VTI):     1.82 cm AV Vmax:           201.00 cm/s AV Vmean:          124.500 cm/s AV VTI:            0.404 m AV Peak Grad:      16.2 mmHg AV Mean Grad:      8.0 mmHg LVOT Vmax:         97.25 cm/s LVOT Vmean:        61.150 cm/s LVOT VTI:          0.212 m LVOT/AV VTI ratio: 0.53 AI PHT:            679 msec  AORTA Ao Root diam: 4.00 cm MITRAL VALVE MV Area (PHT): 2.66 cm    SHUNTS MV Decel Time: 285 msec    Systemic VTI:  0.21 m MV E velocity: 62.10 cm/s  Systemic Diam: 2.10 cm MV A velocity: 81.40 cm/s MV E/A ratio:  0.76 Ena Dawley MD Electronically signed by Ena Dawley MD Signature Date/Time: 07/16/2020/1:55:02 PM    Final    CT HEAD CODE STROKE WO CONTRAST  Result Date: 07/16/2020 CLINICAL DATA:  Code stroke.  Left-sided weakness EXAM: CT HEAD WITHOUT CONTRAST TECHNIQUE: Contiguous axial images were obtained from the base of the skull through the vertex without intravenous contrast. COMPARISON:  None. FINDINGS: Brain: There is a small intraparenchymal hemorrhage in the right basal ganglia measuring 1.5 x 0.8 cm. There  is diffuse volume loss of the brain periventricular white matter hypoattenuation. Vascular: Aneurysm clip in the expected location of the anterior communicating artery. Skull: Remote left-sided craniotomy. Sinuses/Orbits: No fluid levels or advanced mucosal thickening of the visualized paranasal sinuses. No mastoid or middle ear effusion. The orbits are normal. IMPRESSION: 1. Small intraparenchymal hemorrhage in the right basal ganglia, likely hypertensive. 2. Critical Value/emergent results were called by telephone at the time of interpretation on 07/16/2020 at 12:22 am to  provider [facility] , who verbally acknowledged these results. Electronically Signed   By: Ulyses Jarred M.D.   On: 07/16/2020 00:22   VAS US CAROTID  Result Date: 07/16/2020 Carotid Arterial Duplex Study Indications:       CVA and small hypertensive bleed in the basal ganglia. Risk Factors:      Hypertension. Comparison Study:  No prior study Performing Technologist: Sharion Dove RVS  Examination Guidelines: A complete evaluation includes B-mode imaging, spectral Doppler, color Doppler, and power Doppler as needed of all accessible portions of each vessel. Bilateral testing is considered an integral part of a complete examination. Limited examinations for reoccurring indications may be performed as noted.  Right Carotid Findings: +----------+--------+--------+--------+------------------+------------------+           PSV cm/sEDV cm/sStenosisPlaque DescriptionComments           +----------+--------+--------+--------+------------------+------------------+ CCA Prox  101     11                                intimal thickening +----------+--------+--------+--------+------------------+------------------+ CCA Distal64      10                                intimal thickening +----------+--------+--------+--------+------------------+------------------+ ICA Prox  28      8               heterogenous                         +----------+--------+--------+--------+------------------+------------------+ ICA Distal52      11                                tortuous           +----------+--------+--------+--------+------------------+------------------+ ECA       29      6                                 tortuous           +----------+--------+--------+--------+------------------+------------------+ +----------+--------+-------+--------+-------------------+           PSV cm/sEDV cmsDescribeArm Pressure (mmHG) +----------+--------+-------+--------+-------------------+  Subclavian100                                        +----------+--------+-------+--------+-------------------+ +---------+--------+--+--------+--+ VertebralPSV cm/s58EDV cm/s12 +---------+--------+--+--------+--+  Left Carotid Findings: +----------+--------+--------+--------+------------------+------------------+           PSV cm/sEDV cm/sStenosisPlaque DescriptionComments           +----------+--------+--------+--------+------------------+------------------+ CCA Prox  88      22  intimal thickening +----------+--------+--------+--------+------------------+------------------+ CCA Distal76      24                                intimal thickening +----------+--------+--------+--------+------------------+------------------+ ICA Prox  56      14              heterogenous      tortuous           +----------+--------+--------+--------+------------------+------------------+ ICA Distal33      11                                tortuous           +----------+--------+--------+--------+------------------+------------------+ ECA       53      13                                                   +----------+--------+--------+--------+------------------+------------------+ +----------+--------+--------+--------+-------------------+           PSV cm/sEDV cm/sDescribeArm Pressure (mmHG) +----------+--------+--------+--------+-------------------+ [id number]                                          +----------+--------+--------+--------+-------------------+ +---------+--------+--+--------+-+ VertebralPSV cm/s34EDV cm/s9 +---------+--------+--+--------+-+   Summary: Right Carotid: The extracranial vessels were near-normal with only minimal wall                thickening or plaque. Left Carotid: The extracranial vessels were near-normal with only minimal wall               thickening or plaque. Vertebrals:  Bilateral vertebral  arteries demonstrate antegrade flow. Subclavians: Normal flow hemodynamics were seen in bilateral subclavian              arteries. *See table(s) above for measurements and observations.     Preliminary     PHYSICAL EXAM  Temp:  [98 F (36.7 C)-98.6 F (37 C)] 98.1 F (36.7 C) (02/20 0800) Pulse Rate:  [50-75] 57 (02/20 1000) Resp:  [0-24] 13 (02/20 1000) BP: (124-160)/(77-104) 137/98 (02/20 1000) SpO2:  [94 %-99 %] 96 % (02/20 1000)  General - Well nourished, well developed, in no apparent distress.  Ophthalmologic - fundi not visualized due to noncooperation.  Cardiovascular - Regular rhythm and rate.  Mental Status -  Level of arousal and orientation to time, place, and person were intact. Language including expression, naming, repetition, comprehension was assessed and found intact.  Cranial Nerves II - XII - II - Visual field intact OU. III, IV, VI - Extraocular movements intact. V - Facial sensation intact bilaterally. VII - mild left nasolabial fold flattening. VIII - Hearing & vestibular intact bilaterally. X - Palate elevates symmetrically. XI - Chin turning & shoulder shrug intact bilaterally. XII - Tongue protrusion intact.  Motor Strength - The patient's strength was normal in right UE and LE, but left UE proximal 4/5 and distal 5-/5 with pronator drift, left LE proximal 4/5 with distal 4+/5.  Bulk was normal and fasciculations were absent.   Motor Tone - Muscle tone was assessed at the neck and appendages and was normal.  Reflexes - The patient's reflexes were symmetrical in  all extremities and he had no pathological reflexes.  Sensory - Light touch, temperature/pinprick were assessed and were symmetrical.    Coordination - The patient had normal movements in the right hand and foot with no ataxia or dysmetria. However, severe ataxia left FTN and moderate ataxia left HTS, seems out of proportion to the weakness. Tremor was absent.  Gait and Station -  deferred.   ASSESSMENT/PLAN [name] is a 70 y.o. male with history of hypertension, CKD admitted for left-sided weakness. No tPA given due to [place].    ICH:  right BG small ICH secondary to uncontrolled hypertension  CT head right BG small ICH  Repeat CT head stable  Carotid Doppler unremarkable  2D Echo EF 60 to 65%  LDL 47  HgbA1c 4.6  UDS negative  SCD for VTE prophylaxis  No antithrombotic prior to admission, now on No antithrombotic due to [place]  Ongoing aggressive stroke risk factor management  Therapy recommendations: CIR  Disposition: Pending  Hypertensive emergency . Stable . Off Cleviprex this am . On Norvasc 10, labetalol 300 tid, hydralazine 100 q8 . BP goal less than 160  Long term BP goal normotensive  CKD stage IV  Creatinine 1.06 in 2016, 2.93 in 2020  This admission 4.01->4.41  Avoid nephrotoxic agent  Encourage po intake  BMP monitoring  Tobacco abuse  Current smoker  Smoking cessation counseling provided  Pt is willing to quit  Other Stroke Risk Factors  Advanced age  Other [facility] day # 1   This patient is critically ill due to [place], hypertensive emergency, CKD and at significant risk of neurological worsening, death form hematoma expansion, cerebral edema, hypertensive encephalopathy, renal failure. This patient's care requires constant monitoring of vital signs, hemodynamics, respiratory and cardiac monitoring, review of multiple databases, neurological assessment, discussion with family, other specialists and medical decision making of high complexity. I spent 30 minutes of neurocritical care time in the care of this patient.   Rosalin Hawking, MD PhD Stroke Neurology 07/17/2020 11:32 AM    To contact Stroke Continuity provider, please refer to [email or web address] After hours, contact General Neurology
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
## 2020-07-17 NOTE — Progress Notes (Addendum)
Inpatient Rehab Admissions Coordinator Note:   Per therapy recommendations, pt was screened for CIR candidacy by Clemens Catholic, [place] CCC-SLP. At this time, Pt. Demonstrates need in one discipline only. I will await OT eval and re-screen tomorrow.   Clemens Catholic, [place], [place] Admissions Coordinator  [phone] ([place]) [phone] (office)
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
## 2020-07-18 LAB — CBC
HCT: 34.3 % — ABNORMAL LOW (ref 39.0–52.0)
Hemoglobin: 12 g/dL — ABNORMAL LOW (ref 13.0–17.0)
MCH: 34.3 pg — ABNORMAL HIGH (ref 26.0–34.0)
MCHC: 35 g/dL (ref 30.0–36.0)
MCV: 98 fL (ref 80.0–100.0)
Platelets: 195 10*3/uL (ref 150–400)
RBC: 3.5 MIL/uL — ABNORMAL LOW (ref 4.22–5.81)
RDW: 12.1 % (ref 11.5–15.5)
WBC: 7.5 10*3/uL (ref 4.0–10.5)
nRBC: 0 % (ref 0.0–0.2)

## 2020-07-18 LAB — BASIC METABOLIC PANEL
Anion gap: 13 (ref 5–15)
BUN: 43 mg/dL — ABNORMAL HIGH (ref 8–23)
CO2: 18 mmol/L — ABNORMAL LOW (ref 22–32)
Calcium: 8.5 mg/dL — ABNORMAL LOW (ref 8.9–10.3)
Chloride: 111 mmol/L (ref 98–111)
Creatinine, Ser: 4.58 mg/dL — ABNORMAL HIGH (ref 0.61–1.24)
GFR, Estimated: 13 mL/min — ABNORMAL LOW (ref >60)
Glucose, Bld: 87 mg/dL (ref 70–99)
Potassium: 3.8 mmol/L (ref 3.5–5.1)
Sodium: 142 mmol/L (ref 135–145)
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
## 2020-07-18 NOTE — Progress Notes (Signed)
STROKE TEAM PROGRESS NOTE   SUBJECTIVE (INTERVAL HISTORY) No family is at the bedside. Pt sitting in chair, has been walking with PT/OT today. He stated that he still has mild dragging on the left foot, but he was able to walk in the hallway for long distance. Still has left arm weakness and ataxia. PT/OT recommend CIR but seems high functioning now. CIR coordinator will take a look. Pt lives alone however, if needs to go home he prefer to go to his wife (pt and wife seperated) house. Will check with SW too.    OBJECTIVE Temp:  [97.3 F (36.3 C)-98.4 F (36.9 C)] 97.5 F (36.4 C) (02/21 1500) Pulse Rate:  [54-66] 55 (02/21 1500) Cardiac Rhythm: Sinus bradycardia (02/21 1139) Resp:  [16-18] 18 (02/21 1500) BP: (132-159)/(78-98) 132/78 (02/21 1500) SpO2:  [96 %-100 %] 100 % (02/21 1500)  No results for input(s): GLUCAP in the last 168 hours. Recent Labs  Lab 07/16/20 0008 07/17/20 0420 07/18/20 0257  NA 142 143 142  K 4.1 3.8 3.8  CL 109 113* 111  CO2 22 18* 18*  GLUCOSE 95 83 87  BUN 51* 42* 43*  CREATININE 4.01* 4.41* 4.58*  CALCIUM 8.4* 8.4* 8.5*   Recent Labs  Lab 07/16/20 0008  AST 32  ALT 28  ALKPHOS 68  BILITOT 0.7  PROT 7.2  ALBUMIN 3.5   Recent Labs  Lab 07/16/20 0008 07/17/20 0420 07/18/20 0257  WBC 8.6 7.8 7.5  NEUTROABS 4.2  --   --   HGB 12.6* 11.7* 12.0*  HCT 37.0* 35.4* 34.3*  MCV 97.9 98.9 98.0  PLT 213 198 195   No results for input(s): CKTOTAL, CKMB, CKMBINDEX, TROPONINI in the last 168 hours. Recent Labs    07/16/20 0008  LABPROT 12.9  INR 1.0   Recent Labs    07/16/20 0008  COLORURINE YELLOW  LABSPEC 1.015  PHURINE 8.0  GLUCOSEU NEGATIVE  HGBUR NEGATIVE  BILIRUBINUR NEGATIVE  KETONESUR NEGATIVE  PROTEINUR NEGATIVE  NITRITE NEGATIVE  LEUKOCYTESUR NEGATIVE       Component Value Date/Time   CHOL 103 07/17/2020 0420   TRIG 110 07/17/2020 0420   TRIG 111 07/17/2020 0420   HDL 34 (L) 07/17/2020 0420   CHOLHDL 3.0 07/17/2020  0420   VLDL 22 07/17/2020 0420   LDLCALC 47 07/17/2020 0420   Lab Results  Component Value Date   HGBA1C 4.6 (L) 07/17/2020      Component Value Date/Time   LABOPIA NONE DETECTED 07/16/2020 0008   COCAINSCRNUR NONE DETECTED 07/16/2020 0008   LABBENZ NONE DETECTED 07/16/2020 0008   AMPHETMU NONE DETECTED 07/16/2020 0008   THCU NONE DETECTED 07/16/2020 0008   LABBARB NONE DETECTED 07/16/2020 0008    Recent Labs  Lab 07/16/20 0008  ETH <10    I have personally reviewed the radiological images below and agree with the radiology interpretations.  CT HEAD WO CONTRAST  Result Date: 07/16/2020 CLINICAL DATA:  Follow-up intracranial hemorrhage. EXAM: CT HEAD WITHOUT CONTRAST TECHNIQUE: Contiguous axial images were obtained from the base of the skull through the vertex without intravenous contrast. COMPARISON:  07/16/2020 FINDINGS: Brain: An acute parenchymal hemorrhage involving the posterior right basal ganglia/internal capsule/lateral thalamus has not significantly changed in size, measuring 1.4 x 1.0 x 1.2 cm (estimated volume of 0.8 mL). Mild surrounding edema has slightly increased without mass effect. No acute large territory infarct, new intracranial hemorrhage, midline shift, or extra-axial fluid collection is identified. Mild-to-moderate lateral, third, and fourth ventriculomegaly is unchanged  and may reflect a component of underlying chronic communicating hydrocephalus in this patient with a remote history of subarachnoid hemorrhage (versus central predominant cerebral atrophy). Patchy and confluent hypodensities in the cerebral white matter bilaterally are unchanged and nonspecific but compatible with extensive chronic small vessel ischemic disease. Vascular: Aneurysm clip in the anterior communicating region. Skull: Left perianal craniotomy. Sinuses/Orbits: Remote medial right or fracture. Mild mucosal thickening in the paranasal sinuses. Clear mastoid air cells. Other: None.  IMPRESSION: 1. Unchanged size of posterior right basal ganglia region hemorrhage with slightly increased surrounding edema. No mass effect. 2. Extensive chronic small vessel ischemic disease. 3. Unchanged ventriculomegaly. Electronically Signed   By: Logan Bores M.D.   On: 07/16/2020 16:23   ECHOCARDIOGRAM COMPLETE  Result Date: 07/16/2020    ECHOCARDIOGRAM REPORT   Patient Name:   Samuel [name] Date of Exam: 07/16/2020 Medical Rec #:  [id number]       Height:       77.0 in Accession #:    [id number]      Weight:       208.3 lb Date of Birth:  [birth year]       BSA:          2.276 m Patient Age:    70 years        BP:           134/92 mmHg Patient Gender: M               HR:           67 bpm. Exam Location:  Inpatient Procedure: 2D Echo, Cardiac Doppler and Color Doppler Indications:    Stroke 434.91  History:        Patient has no prior history of Echocardiogram examinations.                 Risk Factors:Hypertension. Drug addiction in remission ([place])                 (From Hx), ICH (intracerebral hemorrhage).  Sonographer:    Alvino Chapel RCS Referring Phys: [phone] MCNEILL P [place]  1. There is severe left ventricular hypertrophy, consider evaluation for infiltrative cardiomyopathies such as cardiac amyloidosis.  2. Left ventricular ejection fraction, by estimation, is 60 to 65%. The left ventricle has normal function. The left ventricle has no regional wall motion abnormalities. There is severe concentric left ventricular hypertrophy. Left ventricular diastolic  parameters are consistent with Grade I diastolic dysfunction (impaired relaxation). Elevated left atrial pressure.  3. Right ventricular systolic function is normal. The right ventricular size is normal.  4. Left atrial size was moderately dilated.  5. The mitral valve is normal in structure. Mild mitral valve regurgitation. No evidence of mitral stenosis.  6. The aortic valve is normal in structure. Aortic valve regurgitation is mild.  No aortic stenosis is present.  7. The inferior vena cava is normal in size with greater than 50% respiratory variability, suggesting right atrial pressure of 3 mmHg. FINDINGS  Left Ventricle: Left ventricular ejection fraction, by estimation, is 60 to 65%. The left ventricle has normal function. The left ventricle has no regional wall motion abnormalities. The left ventricular internal cavity size was normal in size. There is  severe concentric left ventricular hypertrophy. Left ventricular diastolic parameters are consistent with Grade I diastolic dysfunction (impaired relaxation). Elevated left atrial pressure. Right Ventricle: The right ventricular size is normal. No increase in right ventricular wall thickness. Right ventricular systolic function is normal. Left  Atrium: Left atrial size was moderately dilated. Right Atrium: Right atrial size was normal in size. Pericardium: There is no evidence of pericardial effusion. Mitral Valve: The mitral valve is normal in structure. Mild mitral valve regurgitation. No evidence of mitral valve stenosis. Tricuspid Valve: The tricuspid valve is normal in structure. Tricuspid valve regurgitation is mild . No evidence of tricuspid stenosis. Aortic Valve: The aortic valve is normal in structure. Aortic valve regurgitation is mild. Aortic regurgitation PHT measures 679 msec. No aortic stenosis is present. Aortic valve mean gradient measures 8.0 mmHg. Aortic valve peak gradient measures 16.2 mmHg. Aortic valve area, by VTI measures 1.82 cm. Pulmonic Valve: The pulmonic valve was normal in structure. Pulmonic valve regurgitation is not visualized. No evidence of pulmonic stenosis. Aorta: The aortic root is normal in size and structure. Venous: The inferior vena cava is normal in size with greater than 50% respiratory variability, suggesting right atrial pressure of 3 mmHg. IAS/Shunts: No atrial level shunt detected by color flow Doppler.  LEFT VENTRICLE PLAX 2D LVIDd:          5.30 cm  Diastology LVIDs:         3.70 cm  LV e' medial:    5.00 cm/s LV PW:         1.70 cm  LV E/e' medial:  12.4 LV IVS:        1.90 cm  LV e' lateral:   7.29 cm/s LVOT diam:     2.10 cm  LV E/e' lateral: 8.5 LV SV:         73 LV SV Index:   32 LVOT Area:     3.46 cm  RIGHT VENTRICLE RV S prime:     14.00 cm/s TAPSE (M-mode): 2.4 cm LEFT ATRIUM              Index       RIGHT ATRIUM           Index LA diam:        3.50 cm  1.54 cm/m  RA Area:     25.80 cm LA Vol (A2C):   157.0 ml 68.98 ml/m RA Volume:   91.70 ml  40.29 ml/m LA Vol (A4C):   125.0 ml 54.92 ml/m LA Biplane Vol: 145.0 ml 63.71 ml/m  AORTIC VALVE AV Area (Vmax):    1.68 cm AV Area (Vmean):   1.70 cm AV Area (VTI):     1.82 cm AV Vmax:           201.00 cm/s AV Vmean:          124.500 cm/s AV VTI:            0.404 m AV Peak Grad:      16.2 mmHg AV Mean Grad:      8.0 mmHg LVOT Vmax:         97.25 cm/s LVOT Vmean:        61.150 cm/s LVOT VTI:          0.212 m LVOT/AV VTI ratio: 0.53 AI PHT:            679 msec  AORTA Ao Root diam: 4.00 cm MITRAL VALVE MV Area (PHT): 2.66 cm    SHUNTS MV Decel Time: 285 msec    Systemic VTI:  0.21 m MV E velocity: 62.10 cm/s  Systemic Diam: 2.10 cm MV A velocity: 81.40 cm/s MV E/A ratio:  0.76 Ena Dawley MD Electronically signed by Ena Dawley MD Signature Date/Time:  07/16/2020/1:55:02 PM    Final    CT HEAD CODE STROKE WO CONTRAST  Result Date: 07/16/2020 CLINICAL DATA:  Code stroke.  Left-sided weakness EXAM: CT HEAD WITHOUT CONTRAST TECHNIQUE: Contiguous axial images were obtained from the base of the skull through the vertex without intravenous contrast. COMPARISON:  None. FINDINGS: Brain: There is a small intraparenchymal hemorrhage in the right basal ganglia measuring 1.5 x 0.8 cm. There is diffuse volume loss of the brain periventricular white matter hypoattenuation. Vascular: Aneurysm clip in the expected location of the anterior communicating artery. Skull: Remote left-sided craniotomy.  Sinuses/Orbits: No fluid levels or advanced mucosal thickening of the visualized paranasal sinuses. No mastoid or middle ear effusion. The orbits are normal. IMPRESSION: 1. Small intraparenchymal hemorrhage in the right basal ganglia, likely hypertensive. 2. Critical Value/emergent results were called by telephone at the time of interpretation on 07/16/2020 at 12:22 am to provider [facility] , who verbally acknowledged these results. Electronically Signed   By: Ulyses Jarred M.D.   On: 07/16/2020 00:22   VAS US CAROTID  Result Date: 07/18/2020 Carotid Arterial Duplex Study Indications:       CVA and small hypertensive bleed in the basal ganglia. Risk Factors:      Hypertension. Comparison Study:  No prior study Performing Technologist: Sharion Dove RVS  Examination Guidelines: A complete evaluation includes B-mode imaging, spectral Doppler, color Doppler, and power Doppler as needed of all accessible portions of each vessel. Bilateral testing is considered an integral part of a complete examination. Limited examinations for reoccurring indications may be performed as noted.  Right Carotid Findings: +----------+--------+--------+--------+------------------+------------------+           PSV cm/sEDV cm/sStenosisPlaque DescriptionComments           +----------+--------+--------+--------+------------------+------------------+ CCA Prox  101     11                                intimal thickening +----------+--------+--------+--------+------------------+------------------+ CCA Distal64      10                                intimal thickening +----------+--------+--------+--------+------------------+------------------+ ICA Prox  28      8               heterogenous                         +----------+--------+--------+--------+------------------+------------------+ ICA Distal52      11                                tortuous            +----------+--------+--------+--------+------------------+------------------+ ECA       29      6                                 tortuous           +----------+--------+--------+--------+------------------+------------------+ +----------+--------+-------+--------+-------------------+           PSV cm/sEDV cmsDescribeArm Pressure (mmHG) +----------+--------+-------+--------+-------------------+ Subclavian100                                        +----------+--------+-------+--------+-------------------+ +---------+--------+--+--------+--+  VertebralPSV cm/s58EDV cm/s12 +---------+--------+--+--------+--+  Left Carotid Findings: +----------+--------+--------+--------+------------------+------------------+           PSV cm/sEDV cm/sStenosisPlaque DescriptionComments           +----------+--------+--------+--------+------------------+------------------+ CCA Prox  88      22                                intimal thickening +----------+--------+--------+--------+------------------+------------------+ CCA Distal76      24                                intimal thickening +----------+--------+--------+--------+------------------+------------------+ ICA Prox  56      14              heterogenous      tortuous           +----------+--------+--------+--------+------------------+------------------+ ICA Distal33      11                                tortuous           +----------+--------+--------+--------+------------------+------------------+ ECA       53      13                                                   +----------+--------+--------+--------+------------------+------------------+ +----------+--------+--------+--------+-------------------+           PSV cm/sEDV cm/sDescribeArm Pressure (mmHG) +----------+--------+--------+--------+-------------------+ [id number]                                           +----------+--------+--------+--------+-------------------+ +---------+--------+--+--------+-+ VertebralPSV cm/s34EDV cm/s9 +---------+--------+--+--------+-+   Summary: Right Carotid: The extracranial vessels were near-normal with only minimal wall                thickening or plaque. Left Carotid: The extracranial vessels were near-normal with only minimal wall               thickening or plaque. Vertebrals:  Bilateral vertebral arteries demonstrate antegrade flow. Subclavians: Normal flow hemodynamics were seen in bilateral subclavian              arteries. *See table(s) above for measurements and observations.  Electronically signed by Antony Contras MD on 07/18/2020 at 8:28:06 AM.    Final     PHYSICAL EXAM  Temp:  [97.3 F (36.3 C)-98.4 F (36.9 C)] 97.5 F (36.4 C) (02/21 1500) Pulse Rate:  [54-66] 55 (02/21 1500) Resp:  [16-18] 18 (02/21 1500) BP: (132-159)/(78-98) 132/78 (02/21 1500) SpO2:  [96 %-100 %] 100 % (02/21 1500)  General - Well nourished, well developed, in no apparent distress.  Ophthalmologic - fundi not visualized due to noncooperation.  Cardiovascular - Regular rhythm and rate.  Mental Status -  Level of arousal and orientation to time, place, and person were intact. Language including expression, naming, repetition, comprehension was assessed and found intact.  Cranial Nerves II - XII - II - Visual field intact OU. III, IV, VI - Extraocular movements intact. V - Facial sensation intact bilaterally. VII - mild left nasolabial fold flattening. VIII - Hearing & vestibular intact  bilaterally. X - Palate elevates symmetrically. XI - Chin turning & shoulder shrug intact bilaterally. XII - Tongue protrusion intact.  Motor Strength - The patient's strength was normal in right UE and LE, but left UE proximal 4/5 and distal 5-/5 with pronator drift, left LE proximal 4/5 with distal 4+/5.  Bulk was normal and fasciculations were absent.   Motor Tone - Muscle tone  was assessed at the neck and appendages and was normal.  Reflexes - The patient's reflexes were symmetrical in all extremities and he had no pathological reflexes.  Sensory - Light touch, temperature/pinprick were assessed and were symmetrical.    Coordination - The patient had normal movements in the right hand and foot with no ataxia or dysmetria. However, severe ataxia left FTN and moderate ataxia left HTS, seems out of proportion to the weakness. Tremor was absent.  Gait and Station - deferred.   ASSESSMENT/PLAN [name] is a 70 y.o. male with history of hypertension, CKD admitted for left-sided weakness. No tPA given due to [facility].    ICH:  right BG small ICH secondary to uncontrolled hypertension  CT head right BG small ICH  Repeat CT head stable  Carotid Doppler unremarkable  2D Echo EF 60 to 65%  LDL 47  HgbA1c 4.6  UDS negative  SCD for VTE prophylaxis  No antithrombotic prior to admission, now on No antithrombotic due to [place]  Ongoing aggressive stroke risk factor management  Therapy recommendations: CIR  Disposition: Pending  Hypertensive emergency . Stable . Off Cleviprex this am . On Norvasc 10, labetalol 300 tid, hydralazine 100 q8 . BP goal less than 160  Long term BP goal normotensive  CKD stage IV  Creatinine 1.06 in 2016, 2.93 in 2020  This admission 4.01->4.41->4.58  Avoid nephrotoxic agent  Encourage po intake  BMP monitoring  Tobacco abuse  Current smoker  Smoking cessation counseling provided  Pt is willing to quit  Other Stroke Risk Factors  Advanced age  Other [facility] day # 2   Rosalin Hawking, MD PhD Stroke Neurology 07/18/2020 6:45 PM    To contact Stroke Continuity provider, please refer to [email or web address] After hours, contact General Neurology

## 2020-07-18 NOTE — Progress Notes (Signed)
Inpatient Rehab Admissions Coordinator Note:   Per therapy recommendations, pt was screened for CIR candidacy by Clemens Catholic, [place] CCC-SLP. At this time, Pt.  Is supervision to [place] with bed mobility and transfers and  Supervision to [place] [facility]  for ADLs. Pt. Does not demonstrate necessity for CIR level therapies at this time. Pt. Would likely benefit from rehab at a lower level of care.     Clemens Catholic, [place], [place] Admissions Coordinator  [phone] ([place]) [phone] (office)
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
## 2020-07-19 DIAGNOSIS — N184 Chronic kidney disease, stage 4 (severe): Secondary | ICD-10-CM | POA: Diagnosis not present

## 2020-07-19 DIAGNOSIS — I169 Hypertensive crisis, unspecified: Secondary | ICD-10-CM

## 2020-07-19 DIAGNOSIS — I610 Nontraumatic intracerebral hemorrhage in hemisphere, subcortical: Secondary | ICD-10-CM | POA: Diagnosis not present

## 2020-07-19 LAB — BASIC METABOLIC PANEL
Anion gap: 12 (ref 5–15)
BUN: 45 mg/dL — ABNORMAL HIGH (ref 8–23)
CO2: 17 mmol/L — ABNORMAL LOW (ref 22–32)
Calcium: 8.6 mg/dL — ABNORMAL LOW (ref 8.9–10.3)
Chloride: 112 mmol/L — ABNORMAL HIGH (ref 98–111)
Creatinine, Ser: 4.68 mg/dL — ABNORMAL HIGH (ref 0.61–1.24)
GFR, Estimated: 13 mL/min — ABNORMAL LOW (ref >60)
Glucose, Bld: 81 mg/dL (ref 70–99)
Potassium: 4.1 mmol/L (ref 3.5–5.1)
Sodium: 141 mmol/L (ref 135–145)

## 2020-07-19 LAB — CBC
HCT: 36.4 % — ABNORMAL LOW (ref 39.0–52.0)
Hemoglobin: 11.7 g/dL — ABNORMAL LOW (ref 13.0–17.0)
MCH: 31.9 pg (ref 26.0–34.0)
MCHC: 32.1 g/dL (ref 30.0–36.0)
MCV: 99.2 fL (ref 80.0–100.0)
Platelets: 157 10*3/uL (ref 150–400)
RBC: 3.67 MIL/uL — ABNORMAL LOW (ref 4.22–5.81)
RDW: 11.9 % (ref 11.5–15.5)
WBC: 6.8 10*3/uL (ref 4.0–10.5)
nRBC: 0 % (ref 0.0–0.2)

## 2020-07-19 MED ORDER — CLONIDINE HCL 0.1 MG PO TABS
0.1000 mg | ORAL_TABLET | Freq: Three times a day (TID) | ORAL | Status: DC
  Administered 2020-07-19 – 2020-07-22 (×11): 0.1 mg via ORAL
  Filled 2020-07-19 (×11): qty 1

## 2020-07-19 NOTE — Plan of Care (Signed)
  Problem: Education: Goal: Knowledge of disease or condition will improve Outcome: Progressing Goal: Knowledge of secondary prevention will improve Outcome: Progressing Goal: Knowledge of patient specific risk factors addressed and post discharge goals established will improve Outcome: Progressing Goal: Individualized Educational Video(s) Outcome: Progressing   Problem: Coping: Goal: Will verbalize positive feelings about self Outcome: Progressing Goal: Will identify appropriate support needs Outcome: Progressing   Problem: Health Behavior/Discharge Planning: Goal: Ability to manage health-related needs will improve Outcome: Progressing   Problem: Self-Care: Goal: Ability to participate in self-care as condition permits will improve Outcome: Progressing Goal: Verbalization of feelings and concerns over difficulty with self-care will improve Outcome: Progressing   Problem: Nutrition: Goal: Risk of aspiration will decrease Outcome: Progressing Goal: Dietary intake will improve Outcome: Progressing   Problem: Intracerebral Hemorrhage Tissue Perfusion: Goal: Complications of Intracerebral Hemorrhage will be minimized Outcome: Progressing   Problem: Education: Goal: Knowledge of General Education information will improve Description: Including pain rating scale, medication(s)/side effects and non-pharmacologic comfort measures Outcome: Progressing   Problem: Health Behavior/Discharge Planning: Goal: Ability to manage health-related needs will improve Outcome: Progressing   Problem: Clinical Measurements: Goal: Ability to maintain clinical measurements within normal limits will improve Outcome: Progressing Goal: Will remain free from infection Outcome: Progressing Goal: Diagnostic test results will improve Outcome: Progressing Goal: Respiratory complications will improve Outcome: Progressing Goal: Cardiovascular complication will be avoided Outcome: Progressing    Problem: Activity: Goal: Risk for activity intolerance will decrease Outcome: Progressing   Problem: Nutrition: Goal: Adequate nutrition will be maintained Outcome: Progressing   Problem: Coping: Goal: Level of anxiety will decrease Outcome: Progressing   Problem: Elimination: Goal: Will not experience complications related to bowel motility Outcome: Progressing Goal: Will not experience complications related to urinary retention Outcome: Progressing   Problem: Pain Managment: Goal: General experience of comfort will improve Outcome: Progressing   Problem: Safety: Goal: Ability to remain free from injury will improve Outcome: Progressing   Problem: Skin Integrity: Goal: Risk for impaired skin integrity will decrease Outcome: Progressing
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
## 2020-07-19 NOTE — NC FL2 (Addendum)
[place] MEDICAID FL2 LEVEL OF CARE SCREENING TOOL     IDENTIFICATION  Patient Name: Samuel [name] Birthdate: Feb 18, 1951 Sex: male Admission Date (Current Location): 07/15/2020  [facility] and [place] Number:  Herbalist and Address:  [facility] [place], [place] [address] [place], [place] [address]      Provider Number: [id number]  Attending Physician Name and Address:  Rosalin Hawking, MD  Relative Name and Phone Number:       Current Level of Care: Hospital Recommended Level of Care: [place] Prior Approval Number:    Date Approved/Denied:   PASRR Number: [id number]  Discharge Plan: SNF    Current Diagnoses: Patient Active Problem List   Diagnosis Date Noted  . ICH (intracerebral hemorrhage) ([place]) 07/16/2020  . Hypertensive crisis   . CKD (chronic kidney disease), stage IV (HCC)     Orientation RESPIRATION BLADDER Height & Weight     Self,Time,Situation,Place  Normal Continent Weight: 208 lb 5.4 oz (94.5 kg) Height:  '6\' 5"'$  (195.6 cm)  BEHAVIORAL SYMPTOMS/MOOD NEUROLOGICAL BOWEL NUTRITION STATUS      Continent Diet (See DC Summary)  AMBULATORY STATUS COMMUNICATION OF NEEDS Skin   Limited Assist Verbally Normal                       Personal Care Assistance Level of Assistance  Bathing,Feeding,Dressing Bathing Assistance: Limited assistance Feeding assistance: Independent Dressing Assistance: Limited assistance     Functional Limitations Info  Sight,Hearing,Speech Sight Info: Adequate Hearing Info: Adequate Speech Info: Adequate    SPECIAL CARE FACTORS FREQUENCY  PT (By licensed PT),OT (By licensed OT)     PT Frequency: 5xweek OT Frequency: 5xweek            Contractures Contractures Info: Not present    Additional Factors Info  Code Status,Allergies Code Status Info: Full Allergies Info: No Known Allergies           Current Medications (07/19/2020):  This is the current hospital active  medication list Current Facility-Administered Medications  Medication Dose Route Frequency Provider Last Rate Last Admin  . 0.9 %  sodium chloride infusion   Intravenous PRN Rosalin Hawking, MD   Stopped at 07/17/20 [phone]  . acetaminophen (TYLENOL) tablet 650 mg  650 mg Oral Q4H PRN Greta Doom, MD       Or  . acetaminophen (TYLENOL) 160 MG/5ML solution 650 mg  650 mg Per Tube Q4H PRN Greta Doom, MD       Or  . acetaminophen (TYLENOL) suppository 650 mg  650 mg Rectal Q4H PRN Greta Doom, MD      . amLODipine (NORVASC) tablet 10 mg  10 mg Oral Daily Rosalin Hawking, MD   10 mg at 07/18/20 1024  . Chlorhexidine Gluconate Cloth 2 % PADS 6 each  6 each Topical Q0600 Greta Doom, MD   6 each at 07/19/20 [phone]  . cloNIDine (CATAPRES) tablet 0.1 mg  0.1 mg Oral TID Rosalin Hawking, MD      . hydrALAZINE (APRESOLINE) tablet 100 mg  100 mg Oral Q8H Rosalin Hawking, MD   100 mg at 07/19/20 0650  . labetalol (NORMODYNE) injection 10 mg  10 mg Intravenous Q2H PRN Rosalin Hawking, MD   10 mg at 07/19/20 0755  . labetalol (NORMODYNE) tablet 300 mg  300 mg Oral TID Rosalin Hawking, MD   300 mg at 07/18/20 1521  . pantoprazole (PROTONIX) EC tablet 40 mg  40  mg Oral Daily Rosalin Hawking, MD   40 mg at 07/18/20 1025  . senna-docusate (Senokot-S) tablet 1 tablet  1 tablet Oral BID Greta Doom, MD   1 tablet at 07/17/20 2139     Discharge Medications: Please see discharge summary for a list of discharge medications.  Relevant Imaging Results:  Relevant Lab Results:   Additional Information SSN: [id number]  Marney Setting, Student-Social Work

## 2020-07-19 NOTE — Progress Notes (Signed)
BP 179/101, PRN labetalol given, MD notified, awaiting new orders, will continue to monitor

## 2020-07-19 NOTE — Progress Notes (Signed)
Inpatient Rehab Admissions Coordinator:   Consult received. Note plans for d/c to SNF and pt is agreeable.  Based on payor trends, likely to take up to 72 hours for insurance authorization process for CIR. As patient is already functioning at min guard or better, and with no SLP needs, would likely not meet criteria for 2 disciplines or functional need for CIR at that time.  Will sign off at this time.  Would benefit from ambulatory referral to [facility] at discharge.   Shann Medal, PT, DPT Admissions Coordinator [phone] 07/19/20  11:58 AM
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
## 2020-07-19 NOTE — TOC Initial Note (Addendum)
Transition of Care [facility]) - Initial/Assessment Note    Patient Details  Name: Samuel [name] MRN: [id number] Date of Birth: [birth year]  Transition of Care [facility]) CM/SW Contact:    Marney Setting, [place] Work Phone Number: 07/19/2020, 11:25 AM  Clinical Narrative:  MSW Student spoke with patient about pursuing SNF placement or home health. Patient chose to go to SNF and his information was faxed out for [facility] facilities.                 Expected Discharge Plan: Skilled Nursing Facility Barriers to Discharge: Continued Medical Work up   Patient Goals and CMS Choice Patient states their goals for this hospitalization and ongoing recovery are:: To go to SNF   Choice offered to / list presented to : Patient  Expected Discharge Plan and Services Expected Discharge Plan: [place] Choice: [place] Living arrangements for the past 2 months: Single Family Home                                      Prior Living Arrangements/Services Living arrangements for the past 2 months: Single Family Home Lives with:: Spouse Patient language and need for interpreter reviewed:: Yes        Need for Family Participation in Patient Care: No (Comment) Care giver support system in place?: No (comment)   Criminal Activity/Legal Involvement Pertinent to Current Situation/Hospitalization: No - Comment as needed  Activities of Daily Living      Permission Sought/Granted Permission sought to share information with : Facility Retail banker granted to share information with : Yes, Verbal Permission Granted  Share Information with NAME: Lieutenant Diego  Permission granted to share info w AGENCY: SNF  Permission granted to share info w Relationship: Spouse     Emotional Assessment Appearance:: Appears stated age Attitude/Demeanor/Rapport: Engaged Affect (typically observed):  Appropriate Orientation: : Oriented to Self,Oriented to Place,Oriented to  Time,Oriented to Situation Alcohol / Substance Use: Not Applicable Psych Involvement: No (comment)  Admission diagnosis:  Hypertensive crisis [I16.9] ICH (intracerebral hemorrhage) ([place]) [I61.9] Intraparenchymal hemorrhage of brain [facility]) [I61.9] Patient Active Problem List   Diagnosis Date Noted  . ICH (intracerebral hemorrhage) ([place]) 07/16/2020  . Hypertensive crisis   . CKD (chronic kidney disease), stage IV ([place])    PCP:  Patient, No Pcp Per Pharmacy:   [facility] [id number] - [place], [address] [place] [address] Phone: [phone] Fax: [phone]     Social Determinants of Health (SDOH) Interventions    Readmission Risk Interventions No flowsheet data found.

## 2020-07-19 NOTE — Care Management Important Message (Signed)
Important Message  Patient Details  Name: Samuel [name] MRN: [id number] Date of Birth: [birth year]   Medicare Important Message Given:  Yes     Orbie Pyo 07/19/2020, 2:23 PM

## 2020-07-19 NOTE — Progress Notes (Signed)
STROKE TEAM PROGRESS NOTE   SUBJECTIVE (INTERVAL HISTORY) Pt is in the hallway working with PT. Pt is able to walk with walker with minimal assistance. Still has mild weakness left UE and LE. PT/OT recommend CIR but CIR felt pt high functioning. Now will need SNF, pt agrees with it.    OBJECTIVE Temp:  [97.8 F (36.6 C)-98.8 F (37.1 C)] 97.8 F (36.6 C) (02/22 2025) Pulse Rate:  [47-62] 47 (02/22 2025) Cardiac Rhythm: Sinus bradycardia (02/22 0700) Resp:  [18-20] 18 (02/22 2025) BP: (148-179)/(70-101) 148/94 (02/22 2025) SpO2:  [97 %-100 %] 97 % (02/22 2025)  No results for input(s): GLUCAP in the last 168 hours. Recent Labs  Lab 07/16/20 0008 07/17/20 0420 07/18/20 0257 07/19/20 0330  NA 142 143 142 141  K 4.1 3.8 3.8 4.1  CL 109 113* 111 112*  CO2 22 18* 18* 17*  GLUCOSE 95 83 87 81  BUN 51* 42* 43* 45*  CREATININE 4.01* 4.41* 4.58* 4.68*  CALCIUM 8.4* 8.4* 8.5* 8.6*   Recent Labs  Lab 07/16/20 0008  AST 32  ALT 28  ALKPHOS 68  BILITOT 0.7  PROT 7.2  ALBUMIN 3.5   Recent Labs  Lab 07/16/20 0008 07/17/20 0420 07/18/20 0257 07/19/20 0330  WBC 8.6 7.8 7.5 6.8  NEUTROABS 4.2  --   --   --   HGB 12.6* 11.7* 12.0* 11.7*  HCT 37.0* 35.4* 34.3* 36.4*  MCV 97.9 98.9 98.0 99.2  PLT 213 198 195 157   No results for input(s): CKTOTAL, CKMB, CKMBINDEX, TROPONINI in the last 168 hours. No results for input(s): LABPROT, INR in the last 72 hours. No results for input(s): COLORURINE, LABSPEC, [place], GLUCOSEU, HGBUR, BILIRUBINUR, KETONESUR, PROTEINUR, UROBILINOGEN, NITRITE, LEUKOCYTESUR in the last 72 hours.  Invalid input(s): APPERANCEUR     Component Value Date/Time   CHOL 103 07/17/2020 0420   TRIG 110 07/17/2020 0420   TRIG 111 07/17/2020 0420   HDL 34 (L) 07/17/2020 0420   CHOLHDL 3.0 07/17/2020 0420   VLDL 22 07/17/2020 0420   LDLCALC 47 07/17/2020 0420   Lab Results  Component Value Date   HGBA1C 4.6 (L) 07/17/2020      Component Value Date/Time    LABOPIA NONE DETECTED 07/16/2020 0008   COCAINSCRNUR NONE DETECTED 07/16/2020 0008   LABBENZ NONE DETECTED 07/16/2020 0008   AMPHETMU NONE DETECTED 07/16/2020 0008   THCU NONE DETECTED 07/16/2020 0008   LABBARB NONE DETECTED 07/16/2020 0008    Recent Labs  Lab 07/16/20 0008  ETH <10    I have personally reviewed the radiological images below and agree with the radiology interpretations.  CT HEAD WO CONTRAST  Result Date: 07/16/2020 CLINICAL DATA:  Follow-up intracranial hemorrhage. EXAM: CT HEAD WITHOUT CONTRAST TECHNIQUE: Contiguous axial images were obtained from the base of the skull through the vertex without intravenous contrast. COMPARISON:  07/16/2020 FINDINGS: Brain: An acute parenchymal hemorrhage involving the posterior right basal ganglia/internal capsule/lateral thalamus has not significantly changed in size, measuring 1.4 x 1.0 x 1.2 cm (estimated volume of 0.8 mL). Mild surrounding edema has slightly increased without mass effect. No acute large territory infarct, new intracranial hemorrhage, midline shift, or extra-axial fluid collection is identified. Mild-to-moderate lateral, third, and fourth ventriculomegaly is unchanged and may reflect a component of underlying chronic communicating hydrocephalus in this patient with a remote history of subarachnoid hemorrhage (versus central predominant cerebral atrophy). Patchy and confluent hypodensities in the cerebral white matter bilaterally are unchanged and nonspecific but compatible with extensive  chronic small vessel ischemic disease. Vascular: Aneurysm clip in the anterior communicating region. Skull: Left perianal craniotomy. Sinuses/Orbits: Remote medial right or fracture. Mild mucosal thickening in the paranasal sinuses. Clear mastoid air cells. Other: None. IMPRESSION: 1. Unchanged size of posterior right basal ganglia region hemorrhage with slightly increased surrounding edema. No mass effect. 2. Extensive chronic small vessel  ischemic disease. 3. Unchanged ventriculomegaly. Electronically Signed   By: Logan Bores M.D.   On: 07/16/2020 16:23   ECHOCARDIOGRAM COMPLETE  Result Date: 07/16/2020    ECHOCARDIOGRAM REPORT   Patient Name:   DEQUAWN KOZIKOWSKI Date of Exam: 07/16/2020 Medical Rec #:  [id number]       Height:       77.0 in Accession #:    [id number]      Weight:       208.3 lb Date of Birth:  Nov 03, 1950       BSA:          2.276 m Patient Age:    66 years        BP:           134/92 mmHg Patient Gender: M               HR:           67 bpm. Exam Location:  Inpatient Procedure: 2D Echo, Cardiac Doppler and Color Doppler Indications:    Stroke 434.91  History:        Patient has no prior history of Echocardiogram examinations.                 Risk Factors:Hypertension. Drug addiction in remission ([place])                 (From Hx), ICH (intracerebral hemorrhage).  Sonographer:    Alvino Chapel RCS Referring Phys: [phone] MCNEILL P Poneto  1. There is severe left ventricular hypertrophy, consider evaluation for infiltrative cardiomyopathies such as cardiac amyloidosis.  2. Left ventricular ejection fraction, by estimation, is 60 to 65%. The left ventricle has normal function. The left ventricle has no regional wall motion abnormalities. There is severe concentric left ventricular hypertrophy. Left ventricular diastolic  parameters are consistent with Grade I diastolic dysfunction (impaired relaxation). Elevated left atrial pressure.  3. Right ventricular systolic function is normal. The right ventricular size is normal.  4. Left atrial size was moderately dilated.  5. The mitral valve is normal in structure. Mild mitral valve regurgitation. No evidence of mitral stenosis.  6. The aortic valve is normal in structure. Aortic valve regurgitation is mild. No aortic stenosis is present.  7. The inferior vena cava is normal in size with greater than 50% respiratory variability, suggesting right atrial pressure of 3 mmHg.  FINDINGS  Left Ventricle: Left ventricular ejection fraction, by estimation, is 60 to 65%. The left ventricle has normal function. The left ventricle has no regional wall motion abnormalities. The left ventricular internal cavity size was normal in size. There is  severe concentric left ventricular hypertrophy. Left ventricular diastolic parameters are consistent with Grade I diastolic dysfunction (impaired relaxation). Elevated left atrial pressure. Right Ventricle: The right ventricular size is normal. No increase in right ventricular wall thickness. Right ventricular systolic function is normal. Left Atrium: Left atrial size was moderately dilated. Right Atrium: Right atrial size was normal in size. Pericardium: There is no evidence of pericardial effusion. Mitral Valve: The mitral valve is normal in structure. Mild mitral valve regurgitation. No evidence of mitral valve stenosis.  Tricuspid Valve: The tricuspid valve is normal in structure. Tricuspid valve regurgitation is mild . No evidence of tricuspid stenosis. Aortic Valve: The aortic valve is normal in structure. Aortic valve regurgitation is mild. Aortic regurgitation PHT measures 679 msec. No aortic stenosis is present. Aortic valve mean gradient measures 8.0 mmHg. Aortic valve peak gradient measures 16.2 mmHg. Aortic valve area, by VTI measures 1.82 cm. Pulmonic Valve: The pulmonic valve was normal in structure. Pulmonic valve regurgitation is not visualized. No evidence of pulmonic stenosis. Aorta: The aortic root is normal in size and structure. Venous: The inferior vena cava is normal in size with greater than 50% respiratory variability, suggesting right atrial pressure of 3 mmHg. IAS/Shunts: No atrial level shunt detected by color flow Doppler.  LEFT VENTRICLE PLAX 2D LVIDd:         5.30 cm  Diastology LVIDs:         3.70 cm  LV e' medial:    5.00 cm/s LV PW:         1.70 cm  LV E/e' medial:  12.4 LV IVS:        1.90 cm  LV e' lateral:   7.29 cm/s  LVOT diam:     2.10 cm  LV E/e' lateral: 8.5 LV SV:         73 LV SV Index:   32 LVOT Area:     3.46 cm  RIGHT VENTRICLE RV S prime:     14.00 cm/s TAPSE (M-mode): 2.4 cm LEFT ATRIUM              Index       RIGHT ATRIUM           Index LA diam:        3.50 cm  1.54 cm/m  RA Area:     25.80 cm LA Vol (A2C):   157.0 ml 68.98 ml/m RA Volume:   91.70 ml  40.29 ml/m LA Vol (A4C):   125.0 ml 54.92 ml/m LA Biplane Vol: 145.0 ml 63.71 ml/m  AORTIC VALVE AV Area (Vmax):    1.68 cm AV Area (Vmean):   1.70 cm AV Area (VTI):     1.82 cm AV Vmax:           201.00 cm/s AV Vmean:          124.500 cm/s AV VTI:            0.404 m AV Peak Grad:      16.2 mmHg AV Mean Grad:      8.0 mmHg LVOT Vmax:         97.25 cm/s LVOT Vmean:        61.150 cm/s LVOT VTI:          0.212 m LVOT/AV VTI ratio: 0.53 AI PHT:            679 msec  AORTA Ao Root diam: 4.00 cm MITRAL VALVE MV Area (PHT): 2.66 cm    SHUNTS MV Decel Time: 285 msec    Systemic VTI:  0.21 m MV E velocity: 62.10 cm/s  Systemic Diam: 2.10 cm MV A velocity: 81.40 cm/s MV E/A ratio:  0.76 Ena Dawley MD Electronically signed by Ena Dawley MD Signature Date/Time: 07/16/2020/1:55:02 PM    Final    CT HEAD CODE STROKE WO CONTRAST  Result Date: 07/16/2020 CLINICAL DATA:  Code stroke.  Left-sided weakness EXAM: CT HEAD WITHOUT CONTRAST TECHNIQUE: Contiguous axial images were obtained from the base of the  skull through the vertex without intravenous contrast. COMPARISON:  None. FINDINGS: Brain: There is a small intraparenchymal hemorrhage in the right basal ganglia measuring 1.5 x 0.8 cm. There is diffuse volume loss of the brain periventricular white matter hypoattenuation. Vascular: Aneurysm clip in the expected location of the anterior communicating artery. Skull: Remote left-sided craniotomy. Sinuses/Orbits: No fluid levels or advanced mucosal thickening of the visualized paranasal sinuses. No mastoid or middle ear effusion. The orbits are normal. IMPRESSION:  1. Small intraparenchymal hemorrhage in the right basal ganglia, likely hypertensive. 2. Critical Value/emergent results were called by telephone at the time of interpretation on 07/16/2020 at 12:22 am to provider [facility] , who verbally acknowledged these results. Electronically Signed   By: Ulyses Jarred M.D.   On: 07/16/2020 00:22   VAS US CAROTID  Result Date: 07/18/2020 Carotid Arterial Duplex Study Indications:       CVA and small hypertensive bleed in the basal ganglia. Risk Factors:      Hypertension. Comparison Study:  No prior study Performing Technologist: Sharion Dove RVS  Examination Guidelines: A complete evaluation includes B-mode imaging, spectral Doppler, color Doppler, and power Doppler as needed of all accessible portions of each vessel. Bilateral testing is considered an integral part of a complete examination. Limited examinations for reoccurring indications may be performed as noted.  Right Carotid Findings: +----------+--------+--------+--------+------------------+------------------+           PSV cm/sEDV cm/sStenosisPlaque DescriptionComments           +----------+--------+--------+--------+------------------+------------------+ CCA Prox  101     11                                intimal thickening +----------+--------+--------+--------+------------------+------------------+ CCA Distal64      10                                intimal thickening +----------+--------+--------+--------+------------------+------------------+ ICA Prox  28      8               heterogenous                         +----------+--------+--------+--------+------------------+------------------+ ICA Distal52      11                                tortuous           +----------+--------+--------+--------+------------------+------------------+ ECA       29      6                                 tortuous            +----------+--------+--------+--------+------------------+------------------+ +----------+--------+-------+--------+-------------------+           PSV cm/sEDV cmsDescribeArm Pressure (mmHG) +----------+--------+-------+--------+-------------------+ Subclavian100                                        +----------+--------+-------+--------+-------------------+ +---------+--------+--+--------+--+ VertebralPSV cm/s58EDV cm/s12 +---------+--------+--+--------+--+  Left Carotid Findings: +----------+--------+--------+--------+------------------+------------------+           PSV cm/sEDV cm/sStenosisPlaque DescriptionComments           +----------+--------+--------+--------+------------------+------------------+ CCA Prox  88  22                                intimal thickening +----------+--------+--------+--------+------------------+------------------+ CCA Distal76      24                                intimal thickening +----------+--------+--------+--------+------------------+------------------+ ICA Prox  56      14              heterogenous      tortuous           +----------+--------+--------+--------+------------------+------------------+ ICA Distal33      11                                tortuous           +----------+--------+--------+--------+------------------+------------------+ ECA       53      13                                                   +----------+--------+--------+--------+------------------+------------------+ +----------+--------+--------+--------+-------------------+           PSV cm/sEDV cm/sDescribeArm Pressure (mmHG) +----------+--------+--------+--------+-------------------+ [id number]                                          +----------+--------+--------+--------+-------------------+ +---------+--------+--+--------+-+ VertebralPSV cm/s34EDV cm/s9 +---------+--------+--+--------+-+   Summary: Right Carotid:  The extracranial vessels were near-normal with only minimal wall                thickening or plaque. Left Carotid: The extracranial vessels were near-normal with only minimal wall               thickening or plaque. Vertebrals:  Bilateral vertebral arteries demonstrate antegrade flow. Subclavians: Normal flow hemodynamics were seen in bilateral subclavian              arteries. *See table(s) above for measurements and observations.  Electronically signed by Antony Contras MD on 07/18/2020 at 8:28:06 AM.    Final     PHYSICAL EXAM  Temp:  [97.8 F (36.6 C)-98.8 F (37.1 C)] 97.8 F (36.6 C) (02/22 2025) Pulse Rate:  [47-62] 47 (02/22 2025) Resp:  [18-20] 18 (02/22 2025) BP: (148-179)/(70-101) 148/94 (02/22 2025) SpO2:  [97 %-100 %] 97 % (02/22 2025)  General - Well nourished, well developed, in no apparent distress.  Ophthalmologic - fundi not visualized due to noncooperation.  Cardiovascular - Regular rhythm and rate.  Mental Status -  Level of arousal and orientation to time, place, and person were intact. Language including expression, naming, repetition, comprehension was assessed and found intact.  Cranial Nerves II - XII - II - Visual field intact OU. III, IV, VI - Extraocular movements intact. V - Facial sensation intact bilaterally. VII - mild left nasolabial fold flattening. VIII - Hearing & vestibular intact bilaterally. X - Palate elevates symmetrically. XI - Chin turning & shoulder shrug intact bilaterally. XII - Tongue protrusion intact.  Motor Strength - The patient's strength was normal in right UE and LE, but left UE proximal 4/5 and distal 5-/5 with  pronator drift, left LE proximal 4/5 with distal 4+/5.  Bulk was normal and fasciculations were absent.   Motor Tone - Muscle tone was assessed at the neck and appendages and was normal.  Reflexes - The patient's reflexes were symmetrical in all extremities and he had no pathological reflexes.  Sensory - Light touch,  temperature/pinprick were assessed and were symmetrical.    Coordination - The patient had normal movements in the right hand and foot with no ataxia or dysmetria. However, severe ataxia left FTN and moderate ataxia left HTS, seems out of proportion to the weakness. Tremor was absent.  Gait and Station - deferred.   ASSESSMENT/PLAN Mr. Dariyon Baringer is a 70 y.o. male with history of hypertension, CKD admitted for left-sided weakness. No tPA given due to [place].    ICH:  right BG small ICH secondary to uncontrolled hypertension  CT head right BG small ICH  Repeat CT head stable  Carotid Doppler unremarkable  2D Echo EF 60 to 65%  LDL 47  HgbA1c 4.6  UDS negative  SCD for VTE prophylaxis  No antithrombotic prior to admission, now on No antithrombotic due to [place]  Ongoing aggressive stroke risk factor management  Therapy recommendations: CIR  Disposition: Pending  Hypertensive emergency . Stable . Off Cleviprex this am . On Norvasc 10, labetalol 300 tid, hydralazine 100 q8 . Added clonidine 0.'1mg'$  tid . BP goal less than 160  Long term BP goal normotensive  CKD stage IV  Creatinine 1.06 in 2016, 2.93 in 2020  This admission 4.01->4.41->4.58->4.68  Avoid nephrotoxic agent  Encourage po intake  BMP monitoring  Tobacco abuse  Current smoker  Smoking cessation counseling provided  Pt is willing to quit  Other Stroke Risk Factors  Advanced age  Other [facility] day # 3   Rosalin Hawking, MD PhD Stroke Neurology 07/19/2020 8:59 PM    To contact Stroke Continuity provider, please refer to [email or web address] After hours, contact General Neurology

## 2020-07-20 LAB — BASIC METABOLIC PANEL
Anion gap: 12 (ref 5–15)
BUN: 46 mg/dL — ABNORMAL HIGH (ref 8–23)
CO2: 17 mmol/L — ABNORMAL LOW (ref 22–32)
Calcium: 8.7 mg/dL — ABNORMAL LOW (ref 8.9–10.3)
Chloride: 111 mmol/L (ref 98–111)
Creatinine, Ser: 4.47 mg/dL — ABNORMAL HIGH (ref 0.61–1.24)
GFR, Estimated: 14 mL/min — ABNORMAL LOW (ref >60)
Glucose, Bld: 78 mg/dL (ref 70–99)
Potassium: 4.1 mmol/L (ref 3.5–5.1)
Sodium: 140 mmol/L (ref 135–145)

## 2020-07-20 LAB — CBC
HCT: 34.1 % — ABNORMAL LOW (ref 39.0–52.0)
Hemoglobin: 11.7 g/dL — ABNORMAL LOW (ref 13.0–17.0)
MCH: 33.6 pg (ref 26.0–34.0)
MCHC: 34.3 g/dL (ref 30.0–36.0)
MCV: 98 fL (ref 80.0–100.0)
Platelets: 199 10*3/uL (ref 150–400)
RBC: 3.48 MIL/uL — ABNORMAL LOW (ref 4.22–5.81)
RDW: 12 % (ref 11.5–15.5)
WBC: 6.3 10*3/uL (ref 4.0–10.5)
nRBC: 0 % (ref 0.0–0.2)
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
## 2020-07-20 NOTE — TOC Progression Note (Signed)
Transition of Care [facility]) - Progression Note    Patient Details  Name: Samuel [name] MRN: [id number] Date of Birth: [birth year]  Transition of Care [facility]) CM/SW [place], [place] Phone Number: 07/20/2020, 3:35 PM  Clinical Narrative:   CSW reached out to [facility] with Admissions at [facility] to ask about bed availability at [facility] for the patient. They are reviewing referral and will get back to CSW with update after review. CSW to follow.    Expected Discharge Plan: [place] Barriers to Discharge: Continued Medical Work up  Expected Discharge Plan and Services Expected Discharge Plan: [place] Choice: [place] arrangements for the past 2 months: Single Family Home                                       Social Determinants of Health (SDOH) Interventions    Readmission Risk Interventions No flowsheet data found.

## 2020-07-20 NOTE — Progress Notes (Signed)
STROKE TEAM PROGRESS NOTE   SUBJECTIVE (INTERVAL HISTORY) Pt wife is at the bedside. Pt is in bathroom for bowel movement. I have seen him working with PT in the hallway with walker, still has left hemiparesis. PT/OT recommend SNF.    OBJECTIVE Temp:  [97.6 F (36.4 C)-98.3 F (36.8 C)] 97.6 F (36.4 C) (02/23 1729) Pulse Rate:  [47-64] 60 (02/23 1729) Cardiac Rhythm: Heart block (02/23 0826) Resp:  [16-20] 17 (02/23 1729) BP: (128-162)/(70-94) 144/93 (02/23 1729) SpO2:  [95 %-100 %] 100 % (02/23 1729)  No results for input(s): GLUCAP in the last 168 hours. Recent Labs  Lab 07/16/20 0008 07/17/20 0420 07/18/20 0257 07/19/20 0330 07/20/20 0409  NA 142 143 142 141 140  K 4.1 3.8 3.8 4.1 4.1  CL 109 113* 111 112* 111  CO2 22 18* 18* 17* 17*  GLUCOSE 95 83 87 81 78  BUN 51* 42* 43* 45* 46*  CREATININE 4.01* 4.41* 4.58* 4.68* 4.47*  CALCIUM 8.4* 8.4* 8.5* 8.6* 8.7*   Recent Labs  Lab 07/16/20 0008  AST 32  ALT 28  ALKPHOS 68  BILITOT 0.7  PROT 7.2  ALBUMIN 3.5   Recent Labs  Lab 07/16/20 0008 07/17/20 0420 07/18/20 0257 07/19/20 0330 07/20/20 0409  WBC 8.6 7.8 7.5 6.8 6.3  NEUTROABS 4.2  --   --   --   --   HGB 12.6* 11.7* 12.0* 11.7* 11.7*  HCT 37.0* 35.4* 34.3* 36.4* 34.1*  MCV 97.9 98.9 98.0 99.2 98.0  PLT 213 198 195 157 199   No results for input(s): CKTOTAL, CKMB, CKMBINDEX, TROPONINI in the last 168 hours. No results for input(s): LABPROT, INR in the last 72 hours. No results for input(s): COLORURINE, LABSPEC, [place], GLUCOSEU, HGBUR, BILIRUBINUR, KETONESUR, PROTEINUR, UROBILINOGEN, NITRITE, LEUKOCYTESUR in the last 72 hours.  Invalid input(s): APPERANCEUR     Component Value Date/Time   CHOL 103 07/17/2020 0420   TRIG 110 07/17/2020 0420   TRIG 111 07/17/2020 0420   HDL 34 (L) 07/17/2020 0420   CHOLHDL 3.0 07/17/2020 0420   VLDL 22 07/17/2020 0420   LDLCALC 47 07/17/2020 0420   Lab Results  Component Value Date   HGBA1C 4.6 (L) 07/17/2020       Component Value Date/Time   LABOPIA NONE DETECTED 07/16/2020 0008   COCAINSCRNUR NONE DETECTED 07/16/2020 0008   LABBENZ NONE DETECTED 07/16/2020 0008   AMPHETMU NONE DETECTED 07/16/2020 0008   THCU NONE DETECTED 07/16/2020 0008   LABBARB NONE DETECTED 07/16/2020 0008    Recent Labs  Lab 07/16/20 0008  ETH <10    I have personally reviewed the radiological images below and agree with the radiology interpretations.  CT HEAD WO CONTRAST  Result Date: 07/16/2020 CLINICAL DATA:  Follow-up intracranial hemorrhage. EXAM: CT HEAD WITHOUT CONTRAST TECHNIQUE: Contiguous axial images were obtained from the base of the skull through the vertex without intravenous contrast. COMPARISON:  07/16/2020 FINDINGS: Brain: An acute parenchymal hemorrhage involving the posterior right basal ganglia/internal capsule/lateral thalamus has not significantly changed in size, measuring 1.4 x 1.0 x 1.2 cm (estimated volume of 0.8 mL). Mild surrounding edema has slightly increased without mass effect. No acute large territory infarct, new intracranial hemorrhage, midline shift, or extra-axial fluid collection is identified. Mild-to-moderate lateral, third, and fourth ventriculomegaly is unchanged and may reflect a component of underlying chronic communicating hydrocephalus in this patient with a remote history of subarachnoid hemorrhage (versus central predominant cerebral atrophy). Patchy and confluent hypodensities in the cerebral white matter  bilaterally are unchanged and nonspecific but compatible with extensive chronic small vessel ischemic disease. Vascular: Aneurysm clip in the anterior communicating region. Skull: Left perianal craniotomy. Sinuses/Orbits: Remote medial right or fracture. Mild mucosal thickening in the paranasal sinuses. Clear mastoid air cells. Other: None. IMPRESSION: 1. Unchanged size of posterior right basal ganglia region hemorrhage with slightly increased surrounding edema. No mass effect.  2. Extensive chronic small vessel ischemic disease. 3. Unchanged ventriculomegaly. Electronically Signed   By: Logan Bores M.D.   On: 07/16/2020 16:23   ECHOCARDIOGRAM COMPLETE  Result Date: 07/16/2020    ECHOCARDIOGRAM REPORT   Patient Name:   Samuel [name] Date of Exam: 07/16/2020 Medical Rec #:  [id number]       Height:       77.0 in Accession #:    [id number]      Weight:       208.3 lb Date of Birth:  1951/02/04       BSA:          2.276 m Patient Age:    70 years        BP:           134/92 mmHg Patient Gender: M               HR:           67 bpm. Exam Location:  Inpatient Procedure: 2D Echo, Cardiac Doppler and Color Doppler Indications:    Stroke 434.91  History:        Patient has no prior history of Echocardiogram examinations.                 Risk Factors:Hypertension. Drug addiction in remission ([place])                 (From Hx), ICH (intracerebral hemorrhage).  Sonographer:    Alvino Chapel RCS Referring Phys: [id number] MCNEILL P Eva  1. There is severe left ventricular hypertrophy, consider evaluation for infiltrative cardiomyopathies such as cardiac amyloidosis.  2. Left ventricular ejection fraction, by estimation, is 60 to 65%. The left ventricle has normal function. The left ventricle has no regional wall motion abnormalities. There is severe concentric left ventricular hypertrophy. Left ventricular diastolic  parameters are consistent with Grade I diastolic dysfunction (impaired relaxation). Elevated left atrial pressure.  3. Right ventricular systolic function is normal. The right ventricular size is normal.  4. Left atrial size was moderately dilated.  5. The mitral valve is normal in structure. Mild mitral valve regurgitation. No evidence of mitral stenosis.  6. The aortic valve is normal in structure. Aortic valve regurgitation is mild. No aortic stenosis is present.  7. The inferior vena cava is normal in size with greater than 50% respiratory variability, suggesting right  atrial pressure of 3 mmHg. FINDINGS  Left Ventricle: Left ventricular ejection fraction, by estimation, is 60 to 65%. The left ventricle has normal function. The left ventricle has no regional wall motion abnormalities. The left ventricular internal cavity size was normal in size. There is  severe concentric left ventricular hypertrophy. Left ventricular diastolic parameters are consistent with Grade I diastolic dysfunction (impaired relaxation). Elevated left atrial pressure. Right Ventricle: The right ventricular size is normal. No increase in right ventricular wall thickness. Right ventricular systolic function is normal. Left Atrium: Left atrial size was moderately dilated. Right Atrium: Right atrial size was normal in size. Pericardium: There is no evidence of pericardial effusion. Mitral Valve: The mitral valve is normal in structure. Mild  mitral valve regurgitation. No evidence of mitral valve stenosis. Tricuspid Valve: The tricuspid valve is normal in structure. Tricuspid valve regurgitation is mild . No evidence of tricuspid stenosis. Aortic Valve: The aortic valve is normal in structure. Aortic valve regurgitation is mild. Aortic regurgitation PHT measures 679 msec. No aortic stenosis is present. Aortic valve mean gradient measures 8.0 mmHg. Aortic valve peak gradient measures 16.2 mmHg. Aortic valve area, by VTI measures 1.82 cm. Pulmonic Valve: The pulmonic valve was normal in structure. Pulmonic valve regurgitation is not visualized. No evidence of pulmonic stenosis. Aorta: The aortic root is normal in size and structure. Venous: The inferior vena cava is normal in size with greater than 50% respiratory variability, suggesting right atrial pressure of 3 mmHg. IAS/Shunts: No atrial level shunt detected by color flow Doppler.  LEFT VENTRICLE PLAX 2D LVIDd:         5.30 cm  Diastology LVIDs:         3.70 cm  LV e' medial:    5.00 cm/s LV PW:         1.70 cm  LV E/e' medial:  12.4 LV IVS:        1.90 cm   LV e' lateral:   7.29 cm/s LVOT diam:     2.10 cm  LV E/e' lateral: 8.5 LV SV:         73 LV SV Index:   32 LVOT Area:     3.46 cm  RIGHT VENTRICLE RV S prime:     14.00 cm/s TAPSE (M-mode): 2.4 cm LEFT ATRIUM              Index       RIGHT ATRIUM           Index LA diam:        3.50 cm  1.54 cm/m  RA Area:     25.80 cm LA Vol (A2C):   157.0 ml 68.98 ml/m RA Volume:   91.70 ml  40.29 ml/m LA Vol (A4C):   125.0 ml 54.92 ml/m LA Biplane Vol: 145.0 ml 63.71 ml/m  AORTIC VALVE AV Area (Vmax):    1.68 cm AV Area (Vmean):   1.70 cm AV Area (VTI):     1.82 cm AV Vmax:           201.00 cm/s AV Vmean:          124.500 cm/s AV VTI:            0.404 m AV Peak Grad:      16.2 mmHg AV Mean Grad:      8.0 mmHg LVOT Vmax:         97.25 cm/s LVOT Vmean:        61.150 cm/s LVOT VTI:          0.212 m LVOT/AV VTI ratio: 0.53 AI PHT:            679 msec  AORTA Ao Root diam: 4.00 cm MITRAL VALVE MV Area (PHT): 2.66 cm    SHUNTS MV Decel Time: 285 msec    Systemic VTI:  0.21 m MV E velocity: 62.10 cm/s  Systemic Diam: 2.10 cm MV A velocity: 81.40 cm/s MV E/A ratio:  0.76 Ena Dawley MD Electronically signed by Ena Dawley MD Signature Date/Time: 07/16/2020/1:55:02 PM    Final    CT HEAD CODE STROKE WO CONTRAST  Result Date: 07/16/2020 CLINICAL DATA:  Code stroke.  Left-sided weakness EXAM: CT HEAD WITHOUT CONTRAST TECHNIQUE: Contiguous  axial images were obtained from the base of the skull through the vertex without intravenous contrast. COMPARISON:  None. FINDINGS: Brain: There is a small intraparenchymal hemorrhage in the right basal ganglia measuring 1.5 x 0.8 cm. There is diffuse volume loss of the brain periventricular white matter hypoattenuation. Vascular: Aneurysm clip in the expected location of the anterior communicating artery. Skull: Remote left-sided craniotomy. Sinuses/Orbits: No fluid levels or advanced mucosal thickening of the visualized paranasal sinuses. No mastoid or middle ear effusion. The  orbits are normal. IMPRESSION: 1. Small intraparenchymal hemorrhage in the right basal ganglia, likely hypertensive. 2. Critical Value/emergent results were called by telephone at the time of interpretation on 07/16/2020 at 12:22 am to provider [facility] , who verbally acknowledged these results. Electronically Signed   By: Ulyses Jarred M.D.   On: 07/16/2020 00:22   VAS US CAROTID  Result Date: 07/18/2020 Carotid Arterial Duplex Study Indications:       CVA and small hypertensive bleed in the basal ganglia. Risk Factors:      Hypertension. Comparison Study:  No prior study Performing Technologist: Sharion Dove RVS  Examination Guidelines: A complete evaluation includes B-mode imaging, spectral Doppler, color Doppler, and power Doppler as needed of all accessible portions of each vessel. Bilateral testing is considered an integral part of a complete examination. Limited examinations for reoccurring indications may be performed as noted.  Right Carotid Findings: +----------+--------+--------+--------+------------------+------------------+           PSV cm/sEDV cm/sStenosisPlaque DescriptionComments           +----------+--------+--------+--------+------------------+------------------+ CCA Prox  101     11                                intimal thickening +----------+--------+--------+--------+------------------+------------------+ CCA Distal64      10                                intimal thickening +----------+--------+--------+--------+------------------+------------------+ ICA Prox  28      8               heterogenous                         +----------+--------+--------+--------+------------------+------------------+ ICA Distal52      11                                tortuous           +----------+--------+--------+--------+------------------+------------------+ ECA       29      6                                 tortuous            +----------+--------+--------+--------+------------------+------------------+ +----------+--------+-------+--------+-------------------+           PSV cm/sEDV cmsDescribeArm Pressure (mmHG) +----------+--------+-------+--------+-------------------+ Subclavian100                                        +----------+--------+-------+--------+-------------------+ +---------+--------+--+--------+--+ VertebralPSV cm/s58EDV cm/s12 +---------+--------+--+--------+--+  Left Carotid Findings: +----------+--------+--------+--------+------------------+------------------+           PSV cm/sEDV cm/sStenosisPlaque DescriptionComments           +----------+--------+--------+--------+------------------+------------------+  CCA Prox  88      22                                intimal thickening +----------+--------+--------+--------+------------------+------------------+ CCA Distal76      24                                intimal thickening +----------+--------+--------+--------+------------------+------------------+ ICA Prox  56      14              heterogenous      tortuous           +----------+--------+--------+--------+------------------+------------------+ ICA Distal33      11                                tortuous           +----------+--------+--------+--------+------------------+------------------+ ECA       53      13                                                   +----------+--------+--------+--------+------------------+------------------+ +----------+--------+--------+--------+-------------------+           PSV cm/sEDV cm/sDescribeArm Pressure (mmHG) +----------+--------+--------+--------+-------------------+ [id number]                                          +----------+--------+--------+--------+-------------------+ +---------+--------+--+--------+-+ VertebralPSV cm/s34EDV cm/s9 +---------+--------+--+--------+-+   Summary: Right Carotid:  The extracranial vessels were near-normal with only minimal wall                thickening or plaque. Left Carotid: The extracranial vessels were near-normal with only minimal wall               thickening or plaque. Vertebrals:  Bilateral vertebral arteries demonstrate antegrade flow. Subclavians: Normal flow hemodynamics were seen in bilateral subclavian              arteries. *See table(s) above for measurements and observations.  Electronically signed by Antony Contras MD on 07/18/2020 at 8:28:06 AM.    Final     PHYSICAL EXAM  Temp:  [97.6 F (36.4 C)-98.3 F (36.8 C)] 97.6 F (36.4 C) (02/23 1729) Pulse Rate:  [47-64] 60 (02/23 1729) Resp:  [16-20] 17 (02/23 1729) BP: (128-162)/(70-94) 144/93 (02/23 1729) SpO2:  [95 %-100 %] 100 % (02/23 1729)  General - Well nourished, well developed, in no apparent distress.  Ophthalmologic - fundi not visualized due to noncooperation.  Cardiovascular - Regular rhythm and rate.  Mental Status -  Level of arousal and orientation to time, place, and person were intact. Language including expression, naming, repetition, comprehension was assessed and found intact.  Cranial Nerves II - XII - II - Visual field intact OU. III, IV, VI - Extraocular movements intact. V - Facial sensation intact bilaterally. VII - mild left nasolabial fold flattening. VIII - Hearing & vestibular intact bilaterally. X - Palate elevates symmetrically. XI - Chin turning & shoulder shrug intact bilaterally. XII - Tongue protrusion intact.  Motor Strength - The patient's strength was normal in right UE and LE,  but left UE proximal 4/5 and distal 5-/5 with pronator drift, left LE proximal 4/5 with distal 4+/5.  Bulk was normal and fasciculations were absent.   Motor Tone - Muscle tone was assessed at the neck and appendages and was normal.  Reflexes - The patient's reflexes were symmetrical in all extremities and he had no pathological reflexes.  Sensory - Light touch,  temperature/pinprick were assessed and were symmetrical.    Coordination - The patient had normal movements in the right hand and foot with no ataxia or dysmetria. However, severe ataxia left FTN and moderate ataxia left HTS, seems out of proportion to the weakness. Tremor was absent.  Gait and Station - deferred.   ASSESSMENT/PLAN [name] is a 70 y.o. male with history of hypertension, CKD admitted for left-sided weakness. No tPA given due to [place].    ICH:  right BG small ICH secondary to uncontrolled hypertension  CT head right BG small ICH  Repeat CT head stable  Carotid Doppler unremarkable  2D Echo EF 60 to 65%  LDL 47  HgbA1c 4.6  UDS negative  SCD for VTE prophylaxis  No antithrombotic prior to admission, now on No antithrombotic due to [place]  Ongoing aggressive stroke risk factor management  Therapy recommendations: SNF  Disposition: Pending  Hypertensive emergency . Stable . Off Cleviprex this am . On Norvasc 10, labetalol 300 tid, hydralazine 100 q8 . Added clonidine 0.'1mg'$  tid . BP goal less than 160  Long term BP goal normotensive  CKD stage IV  Creatinine 1.06 in 2016, 2.93 in 2020  This admission 4.01->4.41->4.58->4.68->4.47  Avoid nephrotoxic agent  Encourage po intake  BMP monitoring  Tobacco abuse  Current smoker  Smoking cessation counseling provided  Pt is willing to quit  Other Stroke Risk Factors  Advanced age  Other [facility] day # 4   Rosalin Hawking, MD PhD Stroke Neurology 07/20/2020 6:04 PM    To contact Stroke Continuity provider, please refer to [email or web address] After hours, contact General Neurology

## 2020-07-21 LAB — CBC
HCT: 35.3 % — ABNORMAL LOW (ref 39.0–52.0)
Hemoglobin: 11.7 g/dL — ABNORMAL LOW (ref 13.0–17.0)
MCH: 32.6 pg (ref 26.0–34.0)
MCHC: 33.1 g/dL (ref 30.0–36.0)
MCV: 98.3 fL (ref 80.0–100.0)
Platelets: 198 10*3/uL (ref 150–400)
RBC: 3.59 MIL/uL — ABNORMAL LOW (ref 4.22–5.81)
RDW: 11.8 % (ref 11.5–15.5)
WBC: 6.4 10*3/uL (ref 4.0–10.5)
nRBC: 0 % (ref 0.0–0.2)

## 2020-07-21 LAB — BASIC METABOLIC PANEL
Anion gap: 10 (ref 5–15)
BUN: 48 mg/dL — ABNORMAL HIGH (ref 8–23)
CO2: 17 mmol/L — ABNORMAL LOW (ref 22–32)
Calcium: 8.7 mg/dL — ABNORMAL LOW (ref 8.9–10.3)
Chloride: 111 mmol/L (ref 98–111)
Creatinine, Ser: 4.59 mg/dL — ABNORMAL HIGH (ref 0.61–1.24)
GFR, Estimated: 13 mL/min — ABNORMAL LOW (ref >60)
Glucose, Bld: 89 mg/dL (ref 70–99)
Potassium: 4.1 mmol/L (ref 3.5–5.1)
Sodium: 138 mmol/L (ref 135–145)

## 2020-07-21 NOTE — Progress Notes (Signed)
STROKE TEAM PROGRESS NOTE   SUBJECTIVE (INTERVAL HISTORY) No family at the bedside. Pt lying in chair, has walked with PT earlier in the hallway, still has left hemiparesis but improving. Pending SNF.   OBJECTIVE Temp:  [97.6 F (36.4 C)-98.2 F (36.8 C)] 97.6 F (36.4 C) (02/24 1122) Pulse Rate:  [54-60] 54 (02/24 1122) Cardiac Rhythm: Normal sinus rhythm (02/24 0801) Resp:  [17-18] 18 (02/24 1122) BP: (134-147)/(82-93) 136/87 (02/24 1122) SpO2:  [96 %-100 %] 98 % (02/24 1122)  No results for input(s): GLUCAP in the last 168 hours. Recent Labs  Lab 07/17/20 0420 07/18/20 0257 07/19/20 0330 07/20/20 0409 07/21/20 0349  NA 143 142 141 140 138  K 3.8 3.8 4.1 4.1 4.1  CL 113* 111 112* 111 111  CO2 18* 18* 17* 17* 17*  GLUCOSE 83 87 81 78 89  BUN 42* 43* 45* 46* 48*  CREATININE 4.41* 4.58* 4.68* 4.47* 4.59*  CALCIUM 8.4* 8.5* 8.6* 8.7* 8.7*   Recent Labs  Lab 07/16/20 0008  AST 32  ALT 28  ALKPHOS 68  BILITOT 0.7  PROT 7.2  ALBUMIN 3.5   Recent Labs  Lab 07/16/20 0008 07/17/20 0420 07/18/20 0257 07/19/20 0330 07/20/20 0409 07/21/20 0349  WBC 8.6 7.8 7.5 6.8 6.3 6.4  NEUTROABS 4.2  --   --   --   --   --   HGB 12.6* 11.7* 12.0* 11.7* 11.7* 11.7*  HCT 37.0* 35.4* 34.3* 36.4* 34.1* 35.3*  MCV 97.9 98.9 98.0 99.2 98.0 98.3  PLT 213 198 195 157 199 198   No results for input(s): CKTOTAL, CKMB, CKMBINDEX, TROPONINI in the last 168 hours. No results for input(s): LABPROT, INR in the last 72 hours. No results for input(s): COLORURINE, LABSPEC, [place], GLUCOSEU, HGBUR, BILIRUBINUR, KETONESUR, PROTEINUR, UROBILINOGEN, NITRITE, LEUKOCYTESUR in the last 72 hours.  Invalid input(s): APPERANCEUR     Component Value Date/Time   CHOL 103 07/17/2020 0420   TRIG 110 07/17/2020 0420   TRIG 111 07/17/2020 0420   HDL 34 (L) 07/17/2020 0420   CHOLHDL 3.0 07/17/2020 0420   VLDL 22 07/17/2020 0420   LDLCALC 47 07/17/2020 0420   Lab Results  Component Value Date    HGBA1C 4.6 (L) 07/17/2020      Component Value Date/Time   LABOPIA NONE DETECTED 07/16/2020 0008   COCAINSCRNUR NONE DETECTED 07/16/2020 0008   LABBENZ NONE DETECTED 07/16/2020 0008   AMPHETMU NONE DETECTED 07/16/2020 0008   THCU NONE DETECTED 07/16/2020 0008   LABBARB NONE DETECTED 07/16/2020 0008    Recent Labs  Lab 07/16/20 0008  ETH <10    I have personally reviewed the radiological images below and agree with the radiology interpretations.  CT HEAD WO CONTRAST  Result Date: 07/16/2020 CLINICAL DATA:  Follow-up intracranial hemorrhage. EXAM: CT HEAD WITHOUT CONTRAST TECHNIQUE: Contiguous axial images were obtained from the base of the skull through the vertex without intravenous contrast. COMPARISON:  07/16/2020 FINDINGS: Brain: An acute parenchymal hemorrhage involving the posterior right basal ganglia/internal capsule/lateral thalamus has not significantly changed in size, measuring 1.4 x 1.0 x 1.2 cm (estimated volume of 0.8 mL). Mild surrounding edema has slightly increased without mass effect. No acute large territory infarct, new intracranial hemorrhage, midline shift, or extra-axial fluid collection is identified. Mild-to-moderate lateral, third, and fourth ventriculomegaly is unchanged and may reflect a component of underlying chronic communicating hydrocephalus in this patient with a remote history of subarachnoid hemorrhage (versus central predominant cerebral atrophy). Patchy and confluent hypodensities in the  cerebral white matter bilaterally are unchanged and nonspecific but compatible with extensive chronic small vessel ischemic disease. Vascular: Aneurysm clip in the anterior communicating region. Skull: Left perianal craniotomy. Sinuses/Orbits: Remote medial right or fracture. Mild mucosal thickening in the paranasal sinuses. Clear mastoid air cells. Other: None. IMPRESSION: 1. Unchanged size of posterior right basal ganglia region hemorrhage with slightly increased  surrounding edema. No mass effect. 2. Extensive chronic small vessel ischemic disease. 3. Unchanged ventriculomegaly. Electronically Signed   By: Logan Bores M.D.   On: 07/16/2020 16:23   ECHOCARDIOGRAM COMPLETE  Result Date: 07/16/2020    ECHOCARDIOGRAM REPORT   Patient Name:   Samuel [name] Date of Exam: 07/16/2020 Medical Rec #:  [id number]       Height:       77.0 in Accession #:    [id number]      Weight:       208.3 lb Date of Birth:  June 27, 1950       BSA:          2.276 m Patient Age:    70 years        BP:           134/92 mmHg Patient Gender: M               HR:           67 bpm. Exam Location:  Inpatient Procedure: 2D Echo, Cardiac Doppler and Color Doppler Indications:    Stroke 434.91  History:        Patient has no prior history of Echocardiogram examinations.                 Risk Factors:Hypertension. Drug addiction in remission ([place])                 (From Hx), ICH (intracerebral hemorrhage).  Sonographer:    Alvino Chapel RCS Referring Phys: [phone] MCNEILL P [place]  1. There is severe left ventricular hypertrophy, consider evaluation for infiltrative cardiomyopathies such as cardiac amyloidosis.  2. Left ventricular ejection fraction, by estimation, is 60 to 65%. The left ventricle has normal function. The left ventricle has no regional wall motion abnormalities. There is severe concentric left ventricular hypertrophy. Left ventricular diastolic  parameters are consistent with Grade I diastolic dysfunction (impaired relaxation). Elevated left atrial pressure.  3. Right ventricular systolic function is normal. The right ventricular size is normal.  4. Left atrial size was moderately dilated.  5. The mitral valve is normal in structure. Mild mitral valve regurgitation. No evidence of mitral stenosis.  6. The aortic valve is normal in structure. Aortic valve regurgitation is mild. No aortic stenosis is present.  7. The inferior vena cava is normal in size with greater than 50%  respiratory variability, suggesting right atrial pressure of 3 mmHg. FINDINGS  Left Ventricle: Left ventricular ejection fraction, by estimation, is 60 to 65%. The left ventricle has normal function. The left ventricle has no regional wall motion abnormalities. The left ventricular internal cavity size was normal in size. There is  severe concentric left ventricular hypertrophy. Left ventricular diastolic parameters are consistent with Grade I diastolic dysfunction (impaired relaxation). Elevated left atrial pressure. Right Ventricle: The right ventricular size is normal. No increase in right ventricular wall thickness. Right ventricular systolic function is normal. Left Atrium: Left atrial size was moderately dilated. Right Atrium: Right atrial size was normal in size. Pericardium: There is no evidence of pericardial effusion. Mitral Valve: The mitral valve is normal  in structure. Mild mitral valve regurgitation. No evidence of mitral valve stenosis. Tricuspid Valve: The tricuspid valve is normal in structure. Tricuspid valve regurgitation is mild . No evidence of tricuspid stenosis. Aortic Valve: The aortic valve is normal in structure. Aortic valve regurgitation is mild. Aortic regurgitation PHT measures 679 msec. No aortic stenosis is present. Aortic valve mean gradient measures 8.0 mmHg. Aortic valve peak gradient measures 16.2 mmHg. Aortic valve area, by VTI measures 1.82 cm. Pulmonic Valve: The pulmonic valve was normal in structure. Pulmonic valve regurgitation is not visualized. No evidence of pulmonic stenosis. Aorta: The aortic root is normal in size and structure. Venous: The inferior vena cava is normal in size with greater than 50% respiratory variability, suggesting right atrial pressure of 3 mmHg. IAS/Shunts: No atrial level shunt detected by color flow Doppler.  LEFT VENTRICLE PLAX 2D LVIDd:         5.30 cm  Diastology LVIDs:         3.70 cm  LV e' medial:    5.00 cm/s LV PW:         1.70 cm  LV  E/e' medial:  12.4 LV IVS:        1.90 cm  LV e' lateral:   7.29 cm/s LVOT diam:     2.10 cm  LV E/e' lateral: 8.5 LV SV:         73 LV SV Index:   32 LVOT Area:     3.46 cm  RIGHT VENTRICLE RV S prime:     14.00 cm/s TAPSE (M-mode): 2.4 cm LEFT ATRIUM              Index       RIGHT ATRIUM           Index LA diam:        3.50 cm  1.54 cm/m  RA Area:     25.80 cm LA Vol (A2C):   157.0 ml 68.98 ml/m RA Volume:   91.70 ml  40.29 ml/m LA Vol (A4C):   125.0 ml 54.92 ml/m LA Biplane Vol: 145.0 ml 63.71 ml/m  AORTIC VALVE AV Area (Vmax):    1.68 cm AV Area (Vmean):   1.70 cm AV Area (VTI):     1.82 cm AV Vmax:           201.00 cm/s AV Vmean:          124.500 cm/s AV VTI:            0.404 m AV Peak Grad:      16.2 mmHg AV Mean Grad:      8.0 mmHg LVOT Vmax:         97.25 cm/s LVOT Vmean:        61.150 cm/s LVOT VTI:          0.212 m LVOT/AV VTI ratio: 0.53 AI PHT:            679 msec  AORTA Ao Root diam: 4.00 cm MITRAL VALVE MV Area (PHT): 2.66 cm    SHUNTS MV Decel Time: 285 msec    Systemic VTI:  0.21 m MV E velocity: 62.10 cm/s  Systemic Diam: 2.10 cm MV A velocity: 81.40 cm/s MV E/A ratio:  0.76 Ena Dawley MD Electronically signed by Ena Dawley MD Signature Date/Time: 07/16/2020/1:55:02 PM    Final    CT HEAD CODE STROKE WO CONTRAST  Result Date: 07/16/2020 CLINICAL DATA:  Code stroke.  Left-sided weakness EXAM: CT HEAD WITHOUT  CONTRAST TECHNIQUE: Contiguous axial images were obtained from the base of the skull through the vertex without intravenous contrast. COMPARISON:  None. FINDINGS: Brain: There is a small intraparenchymal hemorrhage in the right basal ganglia measuring 1.5 x 0.8 cm. There is diffuse volume loss of the brain periventricular white matter hypoattenuation. Vascular: Aneurysm clip in the expected location of the anterior communicating artery. Skull: Remote left-sided craniotomy. Sinuses/Orbits: No fluid levels or advanced mucosal thickening of the visualized paranasal sinuses.  No mastoid or middle ear effusion. The orbits are normal. IMPRESSION: 1. Small intraparenchymal hemorrhage in the right basal ganglia, likely hypertensive. 2. Critical Value/emergent results were called by telephone at the time of interpretation on 07/16/2020 at 12:22 am to provider [facility] , who verbally acknowledged these results. Electronically Signed   By: Ulyses Jarred M.D.   On: 07/16/2020 00:22   VAS US CAROTID  Result Date: 07/18/2020 Carotid Arterial Duplex Study Indications:       CVA and small hypertensive bleed in the basal ganglia. Risk Factors:      Hypertension. Comparison Study:  No prior study Performing Technologist: Sharion Dove RVS  Examination Guidelines: A complete evaluation includes B-mode imaging, spectral Doppler, color Doppler, and power Doppler as needed of all accessible portions of each vessel. Bilateral testing is considered an integral part of a complete examination. Limited examinations for reoccurring indications may be performed as noted.  Right Carotid Findings: +----------+--------+--------+--------+------------------+------------------+           PSV cm/sEDV cm/sStenosisPlaque DescriptionComments           +----------+--------+--------+--------+------------------+------------------+ CCA Prox  101     11                                intimal thickening +----------+--------+--------+--------+------------------+------------------+ CCA Distal64      10                                intimal thickening +----------+--------+--------+--------+------------------+------------------+ ICA Prox  28      8               heterogenous                         +----------+--------+--------+--------+------------------+------------------+ ICA Distal52      11                                tortuous           +----------+--------+--------+--------+------------------+------------------+ ECA       29      6                                 tortuous            +----------+--------+--------+--------+------------------+------------------+ +----------+--------+-------+--------+-------------------+           PSV cm/sEDV cmsDescribeArm Pressure (mmHG) +----------+--------+-------+--------+-------------------+ Subclavian100                                        +----------+--------+-------+--------+-------------------+ +---------+--------+--+--------+--+ VertebralPSV cm/s58EDV cm/s12 +---------+--------+--+--------+--+  Left Carotid Findings: +----------+--------+--------+--------+------------------+------------------+           PSV cm/sEDV cm/sStenosisPlaque DescriptionComments           +----------+--------+--------+--------+------------------+------------------+  CCA Prox  88      22                                intimal thickening +----------+--------+--------+--------+------------------+------------------+ CCA Distal76      24                                intimal thickening +----------+--------+--------+--------+------------------+------------------+ ICA Prox  56      14              heterogenous      tortuous           +----------+--------+--------+--------+------------------+------------------+ ICA Distal33      11                                tortuous           +----------+--------+--------+--------+------------------+------------------+ ECA       53      13                                                   +----------+--------+--------+--------+------------------+------------------+ +----------+--------+--------+--------+-------------------+           PSV cm/sEDV cm/sDescribeArm Pressure (mmHG) +----------+--------+--------+--------+-------------------+ [id number]                                          +----------+--------+--------+--------+-------------------+ +---------+--------+--+--------+-+ VertebralPSV cm/s34EDV cm/s9 +---------+--------+--+--------+-+   Summary:  Right Carotid: The extracranial vessels were near-normal with only minimal wall                thickening or plaque. Left Carotid: The extracranial vessels were near-normal with only minimal wall               thickening or plaque. Vertebrals:  Bilateral vertebral arteries demonstrate antegrade flow. Subclavians: Normal flow hemodynamics were seen in bilateral subclavian              arteries. *See table(s) above for measurements and observations.  Electronically signed by Antony Contras MD on 07/18/2020 at 8:28:06 AM.    Final     PHYSICAL EXAM   Temp:  [97.6 F (36.4 C)-98.2 F (36.8 C)] 97.6 F (36.4 C) (02/24 1122) Pulse Rate:  [54-60] 54 (02/24 1122) Resp:  [17-18] 18 (02/24 1122) BP: (134-147)/(82-93) 136/87 (02/24 1122) SpO2:  [96 %-100 %] 98 % (02/24 1122)  General - Well nourished, well developed, in no apparent distress.  Ophthalmologic - fundi not visualized due to noncooperation.  Cardiovascular - Regular rhythm and rate.  Mental Status -  Level of arousal and orientation to time, place, and person were intact. Language including expression, naming, repetition, comprehension was assessed and found intact.  Cranial Nerves II - XII - II - Visual field intact OU. III, IV, VI - Extraocular movements intact. V - Facial sensation intact bilaterally. VII - mild left nasolabial fold flattening. VIII - Hearing & vestibular intact bilaterally. X - Palate elevates symmetrically. XI - Chin turning & shoulder shrug intact bilaterally. XII - Tongue protrusion intact.  Motor Strength - The patient's strength was normal in right UE and  LE, but left UE proximal 4/5 and distal 5-/5 with pronator drift, left LE proximal 4/5 with distal 4+/5.  Bulk was normal and fasciculations were absent.   Motor Tone - Muscle tone was assessed at the neck and appendages and was normal.  Reflexes - The patient's reflexes were symmetrical in all extremities and he had no pathological reflexes.  Sensory -  Light touch, temperature/pinprick were assessed and were symmetrical.    Coordination - The patient had normal movements in the right hand and foot with no ataxia or dysmetria. However, moderate ataxia left FTN and moderate ataxia left HTS, seems out of proportion to the weakness. Tremor was absent.  Gait and Station - deferred.   ASSESSMENT/PLAN Mr. Heshy Aftab is a 70 y.o. male with history of hypertension and CKD admitted for left-sided weakness. No tPA given due to [place].    ICH:  right BG small ICH secondary to uncontrolled hypertension  CT head right BG small ICH  Repeat CT head stable  Carotid Doppler unremarkable  2D Echo EF 60 to 65%  LDL 47  HgbA1c 4.6  UDS negative  SCD for VTE prophylaxis  No antithrombotic prior to admission, now on No antithrombotic due to [place]  Ongoing aggressive stroke risk factor management  Therapy recommendations: SNF   Disposition: Pending  Hypertensive emergency . Stable . Off Cleviprex this am . On Norvasc 10, labetalol 300 tid, hydralazine 100 q8 . Added clonidine 0.'1mg'$  tid . BP goal less than 160   Long term BP goal normotensive  CKD stage IV  Creatinine 1.06 in 2016, 2.93 in 2020  This admission 4.01->4.41->4.58->4.68->4.47->4.59  Avoid nephrotoxic agent  Encourage po intake  BMP monitoring  Tobacco abuse  Current smoker  Smoking cessation counseling provided  Pt is willing to quit  Other Stroke Risk Factors  Advanced age  Other [facility] day # 5  Rosalin Hawking, MD PhD Stroke Neurology 07/21/2020 7:34 PM  To contact Stroke Continuity provider, please refer to [email or web address] After hours, contact General Neurology

## 2020-07-21 NOTE — Plan of Care (Signed)
MIN ASSIST WITH ADLS. OOB WITH WALKER TO SINK FOR MORNING CARE,STANDBY ASSIST.

## 2020-07-21 NOTE — TOC Progression Note (Signed)
Transition of Care [facility]) - Progression Note    Patient Details  Name: Samuel [name] MRN: [id number] Date of Birth: 1951-02-01  Transition of Care [facility]) CM/SW [place], Rosalia Phone Number: 07/21/2020, 3:45 PM  Clinical Narrative:   CSW following for discharge plan. CSW continued to try to reach Admissions with [facility] for patient's referral, hard faxed for them to review as they could not find it in the hub. CSW called to try to get an answer on referral, and left a message. CSW met with patient, discussed bed offers available, and he doesn't want to wait on [place] to be in [facility], will go to [facility] in [place]. CSW updated [place] and asked them to initiate insurance authorization. CSW to follow.    Expected Discharge Plan: [place] Barriers to Discharge: Continued Medical Work up  Expected Discharge Plan and Services Expected Discharge Plan: [place] Choice: [place] arrangements for the past 2 months: Single Family Home                                       Social Determinants of Health (SDOH) Interventions    Readmission Risk Interventions No flowsheet data found.
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
## 2020-07-22 DIAGNOSIS — F17200 Nicotine dependence, unspecified, uncomplicated: Secondary | ICD-10-CM

## 2020-07-22 LAB — SARS CORONAVIRUS 2 (TAT 6-24 HRS): SARS Coronavirus 2: NEGATIVE

## 2020-07-22 MED ORDER — CLONIDINE HCL 0.1 MG PO TABS: 0.1000 mg | ORAL_TABLET | Freq: Three times a day (TID) | ORAL | 3 refills | Status: DC

## 2020-07-22 MED ORDER — HYDRALAZINE HCL 100 MG PO TABS
100.0000 mg | ORAL_TABLET | Freq: Three times a day (TID) | ORAL | 3 refills | Status: AC
Start: 2020-07-22 — End: ?

## 2020-07-22 MED ORDER — LABETALOL HCL 300 MG PO TABS
300.0000 mg | ORAL_TABLET | Freq: Three times a day (TID) | ORAL | 3 refills | Status: AC
Start: 2020-07-22 — End: ?

## 2020-07-22 NOTE — Discharge Summary (Signed)
Stroke Discharge Summary  Patient ID: Samuel [name]   MRN: [id number]      DOB: November 15, 1950  Date of Admission: 07/15/2020 Date of Discharge: 07/22/2020  Attending Physician:  Samuel Hawking, MD, Stroke MD Consultant(s):    None  Patient's PCP:  Patient, No Pcp Per  DISCHARGE DIAGNOSIS:  Active Problems:   ICH (intracerebral hemorrhage) ([place])   Hypertensive crisis   CKD (chronic kidney disease), stage IV ([place])   HTN   Hypertensive emergency   smoker   Allergies as of 07/22/2020   No Known Allergies     Medication List    STOP taking these medications   lisinopril 10 MG tablet Commonly known as: ZESTRIL     TAKE these medications   amLODipine 10 MG tablet Commonly known as: NORVASC Take 10 mg by mouth daily. What changed: Another medication with the same name was removed. Continue taking this medication, and follow the directions you see here.   Black Currant Seed Oil 500 MG Caps Take 500 mg by mouth daily.   cloNIDine 0.1 MG tablet Commonly known as: CATAPRES Take 1 tablet (0.1 mg total) by mouth 3 (three) times daily. What changed:   medication strength  how much to take  when to take this   hydrALAZINE 100 MG tablet Commonly known as: APRESOLINE Take 1 tablet (100 mg total) by mouth every 8 (eight) hours. What changed: when to take this   labetalol 300 MG tablet Commonly known as: NORMODYNE Take 1 tablet (300 mg total) by mouth 3 (three) times daily. What changed: when to take this       LABORATORY STUDIES CBC    Component Value Date/Time   WBC 6.4 07/21/2020 0349   RBC 3.59 (L) 07/21/2020 0349   HGB 11.7 (L) 07/21/2020 0349   HCT 35.3 (L) 07/21/2020 0349   PLT 198 07/21/2020 0349   MCV 98.3 07/21/2020 0349   MCH 32.6 07/21/2020 0349   MCHC 33.1 07/21/2020 0349   RDW 11.8 07/21/2020 0349   LYMPHSABS 3.3 07/16/2020 0008   MONOABS 0.7 07/16/2020 0008   EOSABS 0.3 07/16/2020 0008   BASOSABS 0.0 07/16/2020 0008   CMP    Component Value  Date/Time   NA 138 07/21/2020 0349   K 4.1 07/21/2020 0349   CL 111 07/21/2020 0349   CO2 17 (L) 07/21/2020 0349   GLUCOSE 89 07/21/2020 0349   BUN 48 (H) 07/21/2020 0349   CREATININE 4.59 (H) 07/21/2020 0349   CALCIUM 8.7 (L) 07/21/2020 0349   PROT 7.2 07/16/2020 0008   ALBUMIN 3.5 07/16/2020 0008   AST 32 07/16/2020 0008   ALT 28 07/16/2020 0008   ALKPHOS 68 07/16/2020 0008   BILITOT 0.7 07/16/2020 0008   GFRNONAA 13 (L) 07/21/2020 0349   GFRAA 24 (L) 05/18/2019 1245   COAGS Lab Results  Component Value Date   INR 1.0 07/16/2020   Lipid Panel    Component Value Date/Time   CHOL 103 07/17/2020 0420   TRIG 110 07/17/2020 0420   TRIG 111 07/17/2020 0420   HDL 34 (L) 07/17/2020 0420   CHOLHDL 3.0 07/17/2020 0420   VLDL 22 07/17/2020 0420   LDLCALC 47 07/17/2020 0420   HgbA1C  Lab Results  Component Value Date   HGBA1C 4.6 (L) 07/17/2020   Urinalysis    Component Value Date/Time   COLORURINE YELLOW 07/16/2020 0008   APPEARANCEUR CLEAR 07/16/2020 0008   LABSPEC 1.015 07/16/2020 0008   PHURINE 8.0 07/16/2020 0008  GLUCOSEU NEGATIVE 07/16/2020 0008   HGBUR NEGATIVE 07/16/2020 0008   BILIRUBINUR NEGATIVE 07/16/2020 0008   KETONESUR NEGATIVE 07/16/2020 0008   PROTEINUR NEGATIVE 07/16/2020 0008   NITRITE NEGATIVE 07/16/2020 0008   LEUKOCYTESUR NEGATIVE 07/16/2020 0008   Urine Drug Screen     Component Value Date/Time   LABOPIA NONE DETECTED 07/16/2020 0008   COCAINSCRNUR NONE DETECTED 07/16/2020 0008   LABBENZ NONE DETECTED 07/16/2020 0008   AMPHETMU NONE DETECTED 07/16/2020 0008   THCU NONE DETECTED 07/16/2020 0008   LABBARB NONE DETECTED 07/16/2020 0008    Alcohol Level    Component Value Date/Time   ETH <10 07/16/2020 0008     SIGNIFICANT DIAGNOSTIC STUDIES CT HEAD WO CONTRAST  Result Date: 07/16/2020 CLINICAL DATA:  Follow-up intracranial hemorrhage. EXAM: CT HEAD WITHOUT CONTRAST TECHNIQUE: Contiguous axial images were obtained from the base of  the skull through the vertex without intravenous contrast. COMPARISON:  07/16/2020 FINDINGS: Brain: An acute parenchymal hemorrhage involving the posterior right basal ganglia/internal capsule/lateral thalamus has not significantly changed in size, measuring 1.4 x 1.0 x 1.2 cm (estimated volume of 0.8 mL). Mild surrounding edema has slightly increased without mass effect. No acute large territory infarct, new intracranial hemorrhage, midline shift, or extra-axial fluid collection is identified. Mild-to-moderate lateral, third, and fourth ventriculomegaly is unchanged and may reflect a component of underlying chronic communicating hydrocephalus in this patient with a remote history of subarachnoid hemorrhage (versus central predominant cerebral atrophy). Patchy and confluent hypodensities in the cerebral white matter bilaterally are unchanged and nonspecific but compatible with extensive chronic small vessel ischemic disease. Vascular: Aneurysm clip in the anterior communicating region. Skull: Left perianal craniotomy. Sinuses/Orbits: Remote medial right or fracture. Mild mucosal thickening in the paranasal sinuses. Clear mastoid air cells. Other: None. IMPRESSION: 1. Unchanged size of posterior right basal ganglia region hemorrhage with slightly increased surrounding edema. No mass effect. 2. Extensive chronic small vessel ischemic disease. 3. Unchanged ventriculomegaly. Electronically Signed   By: Samuel [name] M.D.   On: 07/16/2020 16:23   ECHOCARDIOGRAM COMPLETE  Result Date: 07/16/2020    ECHOCARDIOGRAM REPORT   Patient Name:   Samuel [name] Date of Exam: 07/16/2020 Medical Rec #:  [id number]       Height:       77.0 in Accession #:    [id number]      Weight:       208.3 lb Date of Birth:  [birth year]       BSA:          2.276 m Patient Age:    70 years        BP:           134/92 mmHg Patient Gender: M               HR:           67 bpm. Exam Location:  Inpatient Procedure: 2D Echo, Cardiac Doppler and Color  Doppler Indications:    Stroke 434.91  History:        Patient has no prior history of Echocardiogram examinations.                 Risk Factors:Hypertension. Drug addiction in remission ([place])                 (From Hx), ICH (intracerebral hemorrhage).  Sonographer:    Alvino Chapel RCS Referring Phys: [phone] MCNEILL P [place]  1. There is severe left ventricular hypertrophy, consider evaluation for infiltrative cardiomyopathies such  as cardiac amyloidosis.  2. Left ventricular ejection fraction, by estimation, is 60 to 65%. The left ventricle has normal function. The left ventricle has no regional wall motion abnormalities. There is severe concentric left ventricular hypertrophy. Left ventricular diastolic  parameters are consistent with Grade I diastolic dysfunction (impaired relaxation). Elevated left atrial pressure.  3. Right ventricular systolic function is normal. The right ventricular size is normal.  4. Left atrial size was moderately dilated.  5. The mitral valve is normal in structure. Mild mitral valve regurgitation. No evidence of mitral stenosis.  6. The aortic valve is normal in structure. Aortic valve regurgitation is mild. No aortic stenosis is present.  7. The inferior vena cava is normal in size with greater than 50% respiratory variability, suggesting right atrial pressure of 3 mmHg. FINDINGS  Left Ventricle: Left ventricular ejection fraction, by estimation, is 60 to 65%. The left ventricle has normal function. The left ventricle has no regional wall motion abnormalities. The left ventricular internal cavity size was normal in size. There is  severe concentric left ventricular hypertrophy. Left ventricular diastolic parameters are consistent with Grade I diastolic dysfunction (impaired relaxation). Elevated left atrial pressure. Right Ventricle: The right ventricular size is normal. No increase in right ventricular wall thickness. Right ventricular systolic function is normal. Left  Atrium: Left atrial size was moderately dilated. Right Atrium: Right atrial size was normal in size. Pericardium: There is no evidence of pericardial effusion. Mitral Valve: The mitral valve is normal in structure. Mild mitral valve regurgitation. No evidence of mitral valve stenosis. Tricuspid Valve: The tricuspid valve is normal in structure. Tricuspid valve regurgitation is mild . No evidence of tricuspid stenosis. Aortic Valve: The aortic valve is normal in structure. Aortic valve regurgitation is mild. Aortic regurgitation PHT measures 679 msec. No aortic stenosis is present. Aortic valve mean gradient measures 8.0 mmHg. Aortic valve peak gradient measures 16.2 mmHg. Aortic valve area, by VTI measures 1.82 cm. Pulmonic Valve: The pulmonic valve was normal in structure. Pulmonic valve regurgitation is not visualized. No evidence of pulmonic stenosis. Aorta: The aortic root is normal in size and structure. Venous: The inferior vena cava is normal in size with greater than 50% respiratory variability, suggesting right atrial pressure of 3 mmHg. IAS/Shunts: No atrial level shunt detected by color flow Doppler.  LEFT VENTRICLE PLAX 2D LVIDd:         5.30 cm  Diastology LVIDs:         3.70 cm  LV e' medial:    5.00 cm/s LV PW:         1.70 cm  LV E/e' medial:  12.4 LV IVS:        1.90 cm  LV e' lateral:   7.29 cm/s LVOT diam:     2.10 cm  LV E/e' lateral: 8.5 LV SV:         73 LV SV Index:   32 LVOT Area:     3.46 cm  RIGHT VENTRICLE RV S prime:     14.00 cm/s TAPSE (M-mode): 2.4 cm LEFT ATRIUM              Index       RIGHT ATRIUM           Index LA diam:        3.50 cm  1.54 cm/m  RA Area:     25.80 cm LA Vol (A2C):   157.0 ml 68.98 ml/m RA Volume:   91.70 ml  40.29  ml/m LA Vol (A4C):   125.0 ml 54.92 ml/m LA Biplane Vol: 145.0 ml 63.71 ml/m  AORTIC VALVE AV Area (Vmax):    1.68 cm AV Area (Vmean):   1.70 cm AV Area (VTI):     1.82 cm AV Vmax:           201.00 cm/s AV Vmean:          124.500 cm/s AV VTI:             0.404 m AV Peak Grad:      16.2 mmHg AV Mean Grad:      8.0 mmHg LVOT Vmax:         97.25 cm/s LVOT Vmean:        61.150 cm/s LVOT VTI:          0.212 m LVOT/AV VTI ratio: 0.53 AI PHT:            679 msec  AORTA Ao Root diam: 4.00 cm MITRAL VALVE MV Area (PHT): 2.66 cm    SHUNTS MV Decel Time: 285 msec    Systemic VTI:  0.21 m MV E velocity: 62.10 cm/s  Systemic Diam: 2.10 cm MV A velocity: 81.40 cm/s MV E/A ratio:  0.76 Ena Dawley MD Electronically signed by Ena Dawley MD Signature Date/Time: 07/16/2020/1:55:02 PM    Final    CT HEAD CODE STROKE WO CONTRAST  Result Date: 07/16/2020 CLINICAL DATA:  Code stroke.  Left-sided weakness EXAM: CT HEAD WITHOUT CONTRAST TECHNIQUE: Contiguous axial images were obtained from the base of the skull through the vertex without intravenous contrast. COMPARISON:  None. FINDINGS: Brain: There is a small intraparenchymal hemorrhage in the right basal ganglia measuring 1.5 x 0.8 cm. There is diffuse volume loss of the brain periventricular white matter hypoattenuation. Vascular: Aneurysm clip in the expected location of the anterior communicating artery. Skull: Remote left-sided craniotomy. Sinuses/Orbits: No fluid levels or advanced mucosal thickening of the visualized paranasal sinuses. No mastoid or middle ear effusion. The orbits are normal. IMPRESSION: 1. Small intraparenchymal hemorrhage in the right basal ganglia, likely hypertensive. 2. Critical Value/emergent results were called by telephone at the time of interpretation on 07/16/2020 at 12:22 am to provider [facility] , who verbally acknowledged these results. Electronically Signed   By: Ulyses Jarred M.D.   On: 07/16/2020 00:22   VAS US CAROTID  Result Date: 07/18/2020 Carotid Arterial Duplex Study Indications:       CVA and small hypertensive bleed in the basal ganglia. Risk Factors:      Hypertension. Comparison Study:  No prior study Performing Technologist: Sharion Dove RVS  Examination  Guidelines: A complete evaluation includes B-mode imaging, spectral Doppler, color Doppler, and power Doppler as needed of all accessible portions of each vessel. Bilateral testing is considered an integral part of a complete examination. Limited examinations for reoccurring indications may be performed as noted.  Right Carotid Findings: +----------+--------+--------+--------+------------------+------------------+           PSV cm/sEDV cm/sStenosisPlaque DescriptionComments           +----------+--------+--------+--------+------------------+------------------+ CCA Prox  101     11                                intimal thickening +----------+--------+--------+--------+------------------+------------------+ CCA Distal64      10  intimal thickening +----------+--------+--------+--------+------------------+------------------+ ICA Prox  28      8               heterogenous                         +----------+--------+--------+--------+------------------+------------------+ ICA Distal52      11                                tortuous           +----------+--------+--------+--------+------------------+------------------+ ECA       29      6                                 tortuous           +----------+--------+--------+--------+------------------+------------------+ +----------+--------+-------+--------+-------------------+           PSV cm/sEDV cmsDescribeArm Pressure (mmHG) +----------+--------+-------+--------+-------------------+ Subclavian100                                        +----------+--------+-------+--------+-------------------+ +---------+--------+--+--------+--+ VertebralPSV cm/s58EDV cm/s12 +---------+--------+--+--------+--+  Left Carotid Findings: +----------+--------+--------+--------+------------------+------------------+           PSV cm/sEDV cm/sStenosisPlaque DescriptionComments            +----------+--------+--------+--------+------------------+------------------+ CCA Prox  88      22                                intimal thickening +----------+--------+--------+--------+------------------+------------------+ CCA Distal76      24                                intimal thickening +----------+--------+--------+--------+------------------+------------------+ ICA Prox  56      14              heterogenous      tortuous           +----------+--------+--------+--------+------------------+------------------+ ICA Distal33      11                                tortuous           +----------+--------+--------+--------+------------------+------------------+ ECA       53      13                                                   +----------+--------+--------+--------+------------------+------------------+ +----------+--------+--------+--------+-------------------+           PSV cm/sEDV cm/sDescribeArm Pressure (mmHG) +----------+--------+--------+--------+-------------------+ [id number]                                          +----------+--------+--------+--------+-------------------+ +---------+--------+--+--------+-+ VertebralPSV cm/s34EDV cm/s9 +---------+--------+--+--------+-+   Summary: Right Carotid: The extracranial vessels were near-normal with only minimal wall                thickening or plaque. Left Carotid: The extracranial  vessels were near-normal with only minimal wall               thickening or plaque. Vertebrals:  Bilateral vertebral arteries demonstrate antegrade flow. Subclavians: Normal flow hemodynamics were seen in bilateral subclavian              arteries. *See table(s) above for measurements and observations.  Electronically signed by Antony Contras MD on 07/18/2020 at 8:28:06 AM.    Final       HISTORY OF PRESENT ILLNESS Brason Chason is a 70 y.o. male with a history of hypertension who was in his normal state of health  earlier today.  Around "730 or eight" he developed left-sided weakness which has been persistent since that time.  He was taken to [facility] where CT was performed which demonstrates a small hypertensive bleed in the basal ganglia.  He was severely hypertensive with a [name] pressure of 234/158 on arrival.  LKW: 7:30 PM tpa given?: No, ICH IR Thrombectomy? No, ICH Modified Rankin Scale: 0-Completely asymptomatic and back to baseline post- stroke NIHSS: 3  HOSPITAL COURSE Mr. Nnamdi Drews is a 70 y.o. male with history of hypertension and CKD admitted for left-sided weakness. No tPA given due to [place].    ICH:  right BG small ICH secondary to uncontrolled hypertension  CT head right BG small ICH  Repeat CT head stable  Carotid Doppler unremarkable  2D Echo EF 60 to 65%  LDL 47  HgbA1c 4.6  UDS negative  SCD for VTE prophylaxis  No antithrombotic prior to admission, now on No antithrombotic due to [place]  Ongoing aggressive stroke risk factor management  Therapy recommendations: SNF   Disposition: Pending  Hypertensive emergency  Stable  Off Cleviprex this am  On Norvasc 10, labetalol 300 tid, hydralazine 100 q8  Added clonidine 0.'1mg'$  tid  BP goal less than 160   Long term BP goal normotensive  CKD stage IV  Creatinine 1.06 in 2016, 2.93 in 2020  This admission 4.01->4.41->4.58->4.68->4.47->4.59  Avoid nephrotoxic agent  Encourage po intake  BMP monitoring  Tobacco abuse  Current smoker  Smoking cessation counseling provided  Pt is willing to quit  Other Stroke Risk Factors  Advanced age  Other Active Problems    DISCHARGE EXAM [name] pressure 122/81, pulse (!) 54, temperature 98 F (36.7 C), temperature source Oral, resp. rate 19, height '6\' 5"'$  (1.956 m), weight 94.5 kg, SpO2 98 %.  General - Well nourished, well developed, in no apparent distress.  Ophthalmologic - fundi not visualized due to  noncooperation.  Cardiovascular - Regular rhythm and rate.  Mental Status -  Level of arousal and orientation to time, place, and person were intact. Language including expression, naming, repetition, comprehension was assessed and found intact.  Cranial Nerves II - XII - II - Visual field intact OU. III, IV, VI - Extraocular movements intact. V - Facial sensation intact bilaterally. VII - mild left nasolabial fold flattening. VIII - Hearing & vestibular intact bilaterally. X - Palate elevates symmetrically. XI - Chin turning & shoulder shrug intact bilaterally. XII - Tongue protrusion intact.  Motor Strength - The patient's strength was normal in right UE and LE, but left UE proximal 4/5 and distal 5-/5 with pronator drift, left LE proximal 4/5 with distal 4+/5.  Bulk was normal and fasciculations were absent.   Motor Tone - Muscle tone was assessed at the neck and appendages and was normal.  Reflexes - The patient's reflexes were symmetrical  in all extremities and he had no pathological reflexes.  Sensory - Light touch, temperature/pinprick were assessed and were symmetrical.    Coordination - The patient had normal movements in the right hand and foot with no ataxia or dysmetria. However, moderate ataxia left FTN and moderate ataxia left HTS, seems out of proportion to the weakness. Tremor was absent.  Gait and Station - deferred.  Discharge Diet       Diet   Diet Heart Room service appropriate? Yes; Fluid consistency: Thin   liquids  DISCHARGE PLAN  Disposition:  SNF  Strict BP control, normotensive  Ongoing stroke risk factor control by Primary Care Physician at time of discharge  Follow-up PCP in 2 weeks.  Follow-up in [facility] in 4 weeks, office to schedule an appointment.   35 minutes were spent preparing discharge.  Samuel Hawking, MD PhD Stroke Neurology 07/22/2020 12:05 PM
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
## 2020-07-22 NOTE — Plan of Care (Signed)
Problem: Education: Goal: Knowledge of disease or condition will improve 07/22/2020 0547 by Florestine Avers, RN Outcome: Progressing 07/22/2020 0547 by Florestine Avers, RN Outcome: Progressing Goal: Knowledge of secondary prevention will improve 07/22/2020 0547 by Florestine Avers, RN Outcome: Progressing 07/22/2020 0547 by Florestine Avers, RN Outcome: Progressing Goal: Knowledge of patient specific risk factors addressed and post discharge goals established will improve 07/22/2020 0547 by Florestine Avers, RN Outcome: Progressing 07/22/2020 0547 by Florestine Avers, RN Outcome: Progressing Goal: Individualized Educational Video(s) 07/22/2020 0547 by Florestine Avers, RN Outcome: Progressing 07/22/2020 0547 by Florestine Avers, RN Outcome: Progressing   Problem: Coping: Goal: Will verbalize positive feelings about self 07/22/2020 0547 by Florestine Avers, RN Outcome: Progressing 07/22/2020 0547 by Florestine Avers, RN Outcome: Progressing Goal: Will identify appropriate support needs 07/22/2020 0547 by Florestine Avers, RN Outcome: Progressing 07/22/2020 0547 by Florestine Avers, RN Outcome: Progressing   Problem: Health Behavior/Discharge Planning: Goal: Ability to manage health-related needs will improve 07/22/2020 0547 by Florestine Avers, RN Outcome: Progressing 07/22/2020 0547 by Florestine Avers, RN Outcome: Progressing   Problem: Self-Care: Goal: Ability to participate in self-care as condition permits will improve 07/22/2020 0547 by Florestine Avers, RN Outcome: Progressing 07/22/2020 0547 by Florestine Avers, RN Outcome: Progressing Goal: Verbalization of feelings and concerns over difficulty with self-care will improve 07/22/2020 0547 by Florestine Avers, RN Outcome: Progressing 07/22/2020 0547 by Florestine Avers, RN Outcome: Progressing   Problem: Nutrition: Goal: Risk of aspiration will decrease 07/22/2020  0547 by Florestine Avers, RN Outcome: Progressing 07/22/2020 0547 by Florestine Avers, RN Outcome: Progressing Goal: Dietary intake will improve 07/22/2020 0547 by Florestine Avers, RN Outcome: Progressing 07/22/2020 0547 by Florestine Avers, RN Outcome: Progressing   Problem: Intracerebral Hemorrhage Tissue Perfusion: Goal: Complications of Intracerebral Hemorrhage will be minimized 07/22/2020 0547 by Florestine Avers, RN Outcome: Progressing 07/22/2020 0547 by Florestine Avers, RN Outcome: Progressing   Problem: Education: Goal: Knowledge of General Education information will improve Description: Including pain rating scale, medication(s)/side effects and non-pharmacologic comfort measures 07/22/2020 0547 by Florestine Avers, RN Outcome: Progressing 07/22/2020 0547 by Florestine Avers, RN Outcome: Progressing   Problem: Health Behavior/Discharge Planning: Goal: Ability to manage health-related needs will improve 07/22/2020 0547 by Florestine Avers, RN Outcome: Progressing 07/22/2020 0547 by Florestine Avers, RN Outcome: Progressing   Problem: Clinical Measurements: Goal: Ability to maintain clinical measurements within normal limits will improve 07/22/2020 0547 by Florestine Avers, RN Outcome: Progressing 07/22/2020 0547 by Florestine Avers, RN Outcome: Progressing Goal: Will remain free from infection 07/22/2020 0547 by Florestine Avers, RN Outcome: Progressing 07/22/2020 0547 by Florestine Avers, RN Outcome: Progressing Goal: Diagnostic test results will improve 07/22/2020 0547 by Florestine Avers, RN Outcome: Progressing 07/22/2020 0547 by Florestine Avers, RN Outcome: Progressing Goal: Respiratory complications will improve 07/22/2020 0547 by Florestine Avers, RN Outcome: Progressing 07/22/2020 0547 by Florestine Avers, RN Outcome: Progressing Goal: Cardiovascular complication will be avoided 07/22/2020 0547 by Florestine Avers, RN Outcome: Progressing 07/22/2020 0547 by Florestine Avers, RN Outcome: Progressing   Problem: Activity: Goal: Risk for activity intolerance will decrease 07/22/2020 0547 by Florestine Avers, RN Outcome: Progressing 07/22/2020 0547 by Florestine Avers, RN Outcome: Progressing   Problem: Nutrition: Goal: Adequate nutrition will be maintained 07/22/2020 0547 by Florestine Avers, RN Outcome: Progressing 07/22/2020 0547 by Florestine Avers, RN Outcome:  Progressing   Problem: Coping: Goal: Level of anxiety will decrease 07/22/2020 0547 by Florestine Avers, RN Outcome: Progressing 07/22/2020 0547 by Florestine Avers, RN Outcome: Progressing   Problem: Elimination: Goal: Will not experience complications related to bowel motility 07/22/2020 0547 by Florestine Avers, RN Outcome: Progressing 07/22/2020 0547 by Florestine Avers, RN Outcome: Progressing Goal: Will not experience complications related to urinary retention 07/22/2020 0547 by Florestine Avers, RN Outcome: Progressing 07/22/2020 0547 by Florestine Avers, RN Outcome: Progressing   Problem: Pain Managment: Goal: General experience of comfort will improve 07/22/2020 0547 by Florestine Avers, RN Outcome: Progressing 07/22/2020 0547 by Florestine Avers, RN Outcome: Progressing   Problem: Safety: Goal: Ability to remain free from injury will improve 07/22/2020 0547 by Florestine Avers, RN Outcome: Progressing 07/22/2020 0547 by Florestine Avers, RN Outcome: Progressing   Problem: Skin Integrity: Goal: Risk for impaired skin integrity will decrease 07/22/2020 0547 by Florestine Avers, RN Outcome: Progressing 07/22/2020 0547 by Florestine Avers, RN Outcome: Progressing

## 2020-07-22 NOTE — Progress Notes (Signed)
Reports called to [facility], SBAR used, questions asked and answered.

## 2020-07-22 NOTE — TOC Transition Note (Signed)
Transition of Care [facility]) - CM/SW Discharge Note   Patient Details  Name: [name] MRN: [id number] Date of Birth: 05/03/1951  [facility]) CM/SW Contact:  Geralynn Ochs, LCSW Phone Number: 07/22/2020, 12:19 PM   Clinical Narrative:   Nurse to call report to [phone], Room 127    Final next level of care: Skilled Nursing Facility Barriers to Discharge: Barriers Resolved   Patient Goals and CMS Choice Patient states their goals for this hospitalization and ongoing recovery are:: To go to SNF CMS Medicare.gov Compare Post Acute Care list provided to:: Patient Choice offered to / list presented to : Patient  Discharge Placement              Patient chooses bed at:  [facility]) Patient to be transferred to facility by: [place] Name of family member notified: Self Patient and family notified of of transfer: 07/22/20  Discharge Plan and Services     Post Acute Care Choice: [place]                               Social Determinants of Health (SDOH) Interventions     Readmission Risk Interventions No flowsheet data found.

## 2020-07-22 NOTE — Care Management Important Message (Signed)
Important Message  Patient Details  Name: Samuel [name] MRN: [id number] Date of Birth: 29-Jan-1951   Medicare Important Message Given:  Yes     Barb Merino Shular 07/22/2020, 2:34 PM

## 2020-07-22 NOTE — Plan of Care (Signed)
  Problem: Education: Goal: Knowledge of disease or condition will improve Outcome: Progressing Goal: Knowledge of secondary prevention will improve Outcome: Progressing Goal: Knowledge of patient specific risk factors addressed and post discharge goals established will improve Outcome: Progressing Goal: Individualized Educational Video(s) Outcome: Progressing   Problem: Coping: Goal: Will verbalize positive feelings about self Outcome: Progressing Goal: Will identify appropriate support needs Outcome: Progressing   Problem: Health Behavior/Discharge Planning: Goal: Ability to manage health-related needs will improve Outcome: Progressing   Problem: Self-Care: Goal: Ability to participate in self-care as condition permits will improve Outcome: Progressing Goal: Verbalization of feelings and concerns over difficulty with self-care will improve Outcome: Progressing   Problem: Nutrition: Goal: Risk of aspiration will decrease Outcome: Progressing Goal: Dietary intake will improve Outcome: Progressing   Problem: Intracerebral Hemorrhage Tissue Perfusion: Goal: Complications of Intracerebral Hemorrhage will be minimized Outcome: Progressing   Problem: Education: Goal: Knowledge of General Education information will improve Description: Including pain rating scale, medication(s)/side effects and non-pharmacologic comfort measures Outcome: Progressing   Problem: Health Behavior/Discharge Planning: Goal: Ability to manage health-related needs will improve Outcome: Progressing   Problem: Clinical Measurements: Goal: Ability to maintain clinical measurements within normal limits will improve Outcome: Progressing Goal: Will remain free from infection Outcome: Progressing Goal: Diagnostic test results will improve Outcome: Progressing Goal: Respiratory complications will improve Outcome: Progressing Goal: Cardiovascular complication will be avoided Outcome: Progressing    Problem: Activity: Goal: Risk for activity intolerance will decrease Outcome: Progressing   Problem: Nutrition: Goal: Adequate nutrition will be maintained Outcome: Progressing   Problem: Coping: Goal: Level of anxiety will decrease Outcome: Progressing   Problem: Elimination: Goal: Will not experience complications related to bowel motility Outcome: Progressing Goal: Will not experience complications related to urinary retention Outcome: Progressing   Problem: Pain Managment: Goal: General experience of comfort will improve Outcome: Progressing   Problem: Safety: Goal: Ability to remain free from injury will improve Outcome: Progressing   Problem: Skin Integrity: Goal: Risk for impaired skin integrity will decrease Outcome: Progressing

## 2020-08-18 ENCOUNTER — Inpatient Hospital Stay: Payer: Self-pay | Admitting: Adult Health

## 2020-08-18 ENCOUNTER — Encounter: Payer: Self-pay | Admitting: Adult Health
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
## 2020-09-06 ENCOUNTER — Telehealth: Payer: Self-pay | Admitting: Adult Health

## 2020-09-06 ENCOUNTER — Encounter: Payer: Self-pay | Admitting: Adult Health

## 2020-09-06 ENCOUNTER — Ambulatory Visit (INDEPENDENT_AMBULATORY_CARE_PROVIDER_SITE_OTHER): Payer: Medicare HMO | Admitting: Adult Health

## 2020-09-06 VITALS — BP 145/92 | HR 63 | Ht 77.0 in | Wt 194.0 lb

## 2020-09-06 DIAGNOSIS — G8194 Hemiplegia, unspecified affecting left nondominant side: Secondary | ICD-10-CM | POA: Diagnosis not present

## 2020-09-06 DIAGNOSIS — I610 Nontraumatic intracerebral hemorrhage in hemisphere, subcortical: Secondary | ICD-10-CM | POA: Diagnosis not present

## 2020-09-06 NOTE — Progress Notes (Signed)
I agree with the above plan 
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
## 2020-09-06 NOTE — Telephone Encounter (Signed)
[employer] medicare/medicaid order sent to [facility]. They will obtain the auth and reach out to the patient to schedule.

## 2020-09-06 NOTE — Patient Instructions (Signed)
Start therapies next week - you will likely see great improvement once you start - continue using your walker at all times unless otherwise instructed  You will be called to repeat imaging of brain to ensure bleed has resolved  Continue to follow up with PCP regarding blood pressure management  Maintain strict control of hypertension with blood pressure goal below 130/90       Followup in the future with me in 4 months or call earlier if needed       Thank you for coming to see [place] at [facility]. I hope we have been able to provide you high quality care today.  You may receive a patient satisfaction survey over the next few weeks. We would appreciate your feedback and comments so that we may continue to improve ourselves and the health of our patients.

## 2020-09-09 ENCOUNTER — Ambulatory Visit
Admission: RE | Admit: 2020-09-09 | Discharge: 2020-09-09 | Disposition: A | Discharge status: Home / Self Care | Payer: Medicare HMO | Source: Ambulatory Visit | Attending: Adult Health | Admitting: Adult Health

## 2020-09-09 DIAGNOSIS — I610 Nontraumatic intracerebral hemorrhage in hemisphere, subcortical: Secondary | ICD-10-CM

## 2020-09-12 ENCOUNTER — Telehealth: Payer: Self-pay

## 2020-09-12 NOTE — Telephone Encounter (Signed)
Contacted pt to inform him that recent imaging showed resolution of prior hemorrhage. Advised to call the office with further questions, he understood.

## 2021-01-09 ENCOUNTER — Other Ambulatory Visit: Payer: Self-pay

## 2021-01-09 ENCOUNTER — Ambulatory Visit (INDEPENDENT_AMBULATORY_CARE_PROVIDER_SITE_OTHER): Payer: Medicare HMO | Admitting: Adult Health

## 2021-01-09 ENCOUNTER — Encounter: Payer: Self-pay | Admitting: Adult Health

## 2021-01-09 VITALS — BP 139/86 | HR 64 | Ht 77.0 in | Wt 193.0 lb

## 2021-01-09 DIAGNOSIS — G8194 Hemiplegia, unspecified affecting left nondominant side: Secondary | ICD-10-CM | POA: Diagnosis not present

## 2021-01-09 DIAGNOSIS — N185 Chronic kidney disease, stage 5: Secondary | ICD-10-CM

## 2021-01-09 DIAGNOSIS — I610 Nontraumatic intracerebral hemorrhage in hemisphere, subcortical: Secondary | ICD-10-CM | POA: Diagnosis not present

## 2021-01-09 DIAGNOSIS — I120 Hypertensive chronic kidney disease with stage 5 chronic kidney disease or end stage renal disease: Secondary | ICD-10-CM | POA: Diagnosis not present

## 2021-01-09 NOTE — Patient Instructions (Addendum)
Continue your current exercise routine for hopeful further stroke recovery and improvement  Continue aspirin '325mg'$  daily for secondary stroke prevention  Continue to follow up with PCP regarding blood pressure management  Maintain strict control of hypertension with blood pressure goal below 130/90      Followup in the future with me in 6 months or call earlier if needed       Thank you for coming to see [place] at [facility]. I hope we have been able to provide you high quality care today.  You may receive a patient satisfaction survey over the next few weeks. We would appreciate your feedback and comments so that we may continue to improve ourselves and the health of our patients.
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
## 2021-01-09 NOTE — Progress Notes (Signed)
I agree with the above plan 

## 2021-05-03 ENCOUNTER — Telehealth: Payer: Self-pay | Admitting: *Deleted

## 2021-05-03 NOTE — Telephone Encounter (Signed)
Received fax from [facility] re: patient is scheduled for colonoscopy on 05/11/21. Placed on NP's desk for completion.

## 2021-05-04 NOTE — Telephone Encounter (Signed)
Completed and placed in outbox.  Thank you.

## 2021-05-04 NOTE — Telephone Encounter (Signed)
Completed form has been sent back to [facility] # fax # [phone], confirmation received.

## 2021-05-12 NOTE — Telephone Encounter (Signed)
[place] [facility]) received clearance form but was not signed by the physician and dated. Need form faxed by Monday, procedure scheduled Tuesday.  Contact info: [phone]

## 2021-05-15 NOTE — Telephone Encounter (Signed)
I have refaxed the medical clearance form. Initial form sent on 05/04/2021 did include the providers signature. Confirmation of today's fax received as well.

## 2021-07-17 ENCOUNTER — Other Ambulatory Visit: Payer: Self-pay

## 2021-07-17 ENCOUNTER — Encounter: Payer: Self-pay | Admitting: Adult Health

## 2021-07-17 ENCOUNTER — Ambulatory Visit (INDEPENDENT_AMBULATORY_CARE_PROVIDER_SITE_OTHER): Payer: Medicare HMO | Admitting: Adult Health

## 2021-07-17 VITALS — BP 118/79 | HR 68 | Ht 77.0 in | Wt 198.2 lb

## 2021-07-17 DIAGNOSIS — I610 Nontraumatic intracerebral hemorrhage in hemisphere, subcortical: Secondary | ICD-10-CM | POA: Diagnosis not present

## 2021-07-17 NOTE — Patient Instructions (Signed)
Continue aspirin 325 mg daily  for secondary stroke prevention  Continue to follow up with PCP regarding blood pressure management  Maintain strict control of hypertension with blood pressure goal below 130/90  Signs of a Stroke? Follow the BEFAST method:  Balance Watch for a sudden loss of balance, trouble with coordination or vertigo Eyes Is there a sudden loss of vision in one or both eyes? Or double vision?  Face: Ask the person to smile. Does one side of the face droop or is it numb?  Arms: Ask the person to raise both arms. Does one arm drift downward? Is there weakness or numbness of a leg? Speech: Ask the person to repeat a simple phrase. Does the speech sound slurred/strange? Is the person confused ? Time: If you observe any of these signs, call 911.    Overall stable from stroke standpoint -ensure routine close follow-up with PCP for aggressive stroke risk factor management and can follow up here as needed for stroke     Thank you for coming to see [place] at [facility]. I hope we have been able to provide you high quality care today.  You may receive a patient satisfaction survey over the next few weeks. We would appreciate your feedback and comments so that we may continue to improve ourselves and the health of our patients.
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
## 2021-11-01 ENCOUNTER — Encounter (HOSPITAL_BASED_OUTPATIENT_CLINIC_OR_DEPARTMENT_OTHER): Payer: Self-pay

## 2021-11-01 ENCOUNTER — Emergency Department (HOSPITAL_BASED_OUTPATIENT_CLINIC_OR_DEPARTMENT_OTHER)
Admission: EM | Admit: 2021-11-01 | Discharge: 2021-11-01 | Disposition: A | Discharge status: Home / Self Care | Payer: Medicare HMO | Attending: Emergency Medicine | Admitting: Emergency Medicine

## 2021-11-01 DIAGNOSIS — X58XXXA Exposure to other specified factors, initial encounter: Secondary | ICD-10-CM | POA: Insufficient documentation

## 2021-11-01 DIAGNOSIS — Z7982 Long term (current) use of aspirin: Secondary | ICD-10-CM | POA: Insufficient documentation

## 2021-11-01 DIAGNOSIS — Z98818 Other dental procedure status: Secondary | ICD-10-CM | POA: Diagnosis not present

## 2021-11-01 DIAGNOSIS — Z79899 Other long term (current) drug therapy: Secondary | ICD-10-CM | POA: Insufficient documentation

## 2021-11-01 DIAGNOSIS — S01512A Laceration without foreign body of oral cavity, initial encounter: Secondary | ICD-10-CM | POA: Insufficient documentation

## 2021-11-01 DIAGNOSIS — I10 Essential (primary) hypertension: Secondary | ICD-10-CM | POA: Diagnosis not present

## 2021-11-01 DIAGNOSIS — S0993XA Unspecified injury of face, initial encounter: Secondary | ICD-10-CM | POA: Diagnosis present

## 2021-11-01 DIAGNOSIS — K08409 Partial loss of teeth, unspecified cause, unspecified class: Secondary | ICD-10-CM

## 2021-11-01 MED ORDER — LIDOCAINE-EPINEPHRINE 2 %-1:100000 IJ SOLN
20.0000 mL | Freq: Once | INTRAMUSCULAR | Status: AC
  Administered 2021-11-01: 20 mL

## 2021-11-01 MED ORDER — GELATIN ABSORBABLE 12-7 MM EX MISC
CUTANEOUS | Status: AC
  Filled 2021-11-01: qty 1

## 2021-11-01 MED ORDER — LIDOCAINE-EPINEPHRINE (PF) 2 %-1:200000 IJ SOLN
INTRAMUSCULAR | Status: AC
  Filled 2021-11-01: qty 20

## 2021-11-01 NOTE — ED Notes (Signed)
ED Provider at bedside. 

## 2021-11-01 NOTE — ED Triage Notes (Signed)
Had teeth pulled yesterday at 1530. States hasnt stopped bleeding since. States feels lightheaded and pain in mouth. Gauze applied during triage and instructed patient to bite down.

## 2021-11-02 NOTE — ED Provider Notes (Signed)
[facility] EMERGENCY DEPARTMENT Provider Note   CSN: [id number] Arrival date & time: 11/01/21  1226     History  Chief Complaint  Patient presents with  . Dental Pain    Samuel [name] is a 71 y.o. male.  Pt is a 71 yo male with pmhx significant for htn.  He had all of his teeth removed yesterday at [facility] in [facility].  Pt said he has had bleeding from his gums ever since the extraction.  Family said they called the dental clinic and were told to "let them know how it is doing" and go to the ED.  (This is during the day on a Wednesday when the clinic is open).        Home Medications Prior to Admission medications   Medication Sig Start Date End Date Taking? Authorizing Provider  amLODipine (NORVASC) 10 MG tablet Take 10 mg by mouth daily. 06/16/20   [provider]  aspirin 325 MG tablet Take 325 mg by mouth daily. 08/18/20   [provider]  hydrALAZINE (APRESOLINE) 100 MG tablet Take 1 tablet (100 mg total) by mouth every 8 (eight) hours. 07/22/20   Rosalin Hawking, MD  labetalol (NORMODYNE) 300 MG tablet Take 1 tablet (300 mg total) by mouth 3 (three) times daily. 07/22/20   Rosalin Hawking, MD      Allergies    Patient has no known allergies.    Review of Systems   Review of Systems  HENT:         Bleeding from gums  All other systems reviewed and are negative.   Physical Exam Updated Vital Signs BP 110/80   Pulse 70   Temp 98 F (36.7 C)   Resp 16   Ht 6\' 5"  (1.956 m)   Wt 87.5 kg   SpO2 99%   BMI 22.89 kg/m  Physical Exam Vitals and nursing note reviewed.  Constitutional:      Appearance: Normal appearance.  HENT:     Head: Normocephalic and atraumatic.     Right Ear: External ear normal.     Left Ear: External ear normal.     Nose: Nose normal.     Mouth/Throat:     Comments: Bleeding isolated to right upper mid gums.  Sutures have dehisced. Eyes:     Extraocular Movements: Extraocular movements intact.      Conjunctiva/sclera: Conjunctivae normal.     Pupils: Pupils are equal, round, and reactive to light.  Cardiovascular:     Rate and Rhythm: Normal rate and regular rhythm.     Pulses: Normal pulses.     Heart sounds: Normal heart sounds.  Pulmonary:     Effort: Pulmonary effort is normal.     Breath sounds: Normal breath sounds.  Abdominal:     General: Abdomen is flat. Bowel sounds are normal.     Palpations: Abdomen is soft.  Musculoskeletal:        General: Normal range of motion.     Cervical back: Normal range of motion and neck supple.  Skin:    General: Skin is warm.     Capillary Refill: Capillary refill takes less than 2 seconds.  Neurological:     General: No focal deficit present.     Mental Status: He is alert and oriented to person, place, and time.  Psychiatric:        Mood and Affect: Mood normal.        Behavior: Behavior normal.  ED Results / Procedures / Treatments   Labs (all labs ordered are listed, but only abnormal results are displayed) Labs Reviewed - No data to display  EKG None  Radiology No results found.  Procedures .Marland KitchenLaceration Repair  Date/Time: 11/02/2021 9:36 AM  Performed by: Isla Pence, MD Authorized by: Isla Pence, MD   Consent:    Consent obtained:  Verbal   Consent given by:  Patient   Risks, benefits, and alternatives were discussed: yes     Risks discussed:  Pain Universal protocol:    Patient identity confirmed:  Verbally with patient Anesthesia:    Anesthesia method:  Local infiltration   Local anesthetic:  Lidocaine 2% WITH epi Laceration details:    Location:  Mouth   Length (cm):  2 Skin repair:    Repair method:  Sutures   Suture size:  4-0   Suture material:  Chromic gut   Suture technique:  Simple interrupted   Number of sutures:  7 Approximation:    Approximation:  Close Repair type:    Repair type:  Intermediate Post-procedure details:    Procedure completion:  Tolerated well, no immediate  complications Comments:     Surgicel applied after sutures as he was oozing from needle sites.  Hemostasis controlled.     Medications Ordered in ED Medications  lidocaine-EPINEPHrine (XYLOCAINE W/EPI) 2 %-1:100000 (with pres) injection 20 mL (20 mLs Infiltration Given by Other 11/01/21 1418)    ED Course/ Medical Decision Making/ A&P                           Medical Decision Making Risk Prescription drug management.   I called Patent examiner in [employer].  The secretary would not connect me with a dentist.  She would only connect me with a voice mail machine.  In my opinion, it is terrible care to tell a dental patient to go to the ED when their office is open and they removed the teeth.  Luckily, I was able to stop the bleeding, but the ED is not the appropriate setting especially if the office was open.   This pt is told to use a tea bag at home and to hold pressure for 10 minutes if he has any further bleeding.  He is told to return if worse.  We would be happy to care for pt.        Final Clinical Impression(s) / ED Diagnoses Final diagnoses:  S/P tooth extraction    Rx / DC Orders ED Discharge Orders     None         Isla Pence, MD 11/02/21 1610    Isla Pence, MD 11/08/21 1520

## 2022-02-11 IMAGING — CT CT HEAD CODE STROKE
3 series · 15 of 47 positions shown, 18 images · non-contrast
Comparison: None.

CLINICAL DATA: Code stroke.  Left-sided weakness

EXAM:
CT HEAD WITHOUT CONTRAST
TECHNIQUE: Contiguous axial images were obtained from the base of the skull
through the vertex without intravenous contrast.

[Series 3: head wo · axial · 0.43mm/px · z∈[+830,+984]mm · 9 of 37 slices shown, 12 images]
[im 3/37  brain]
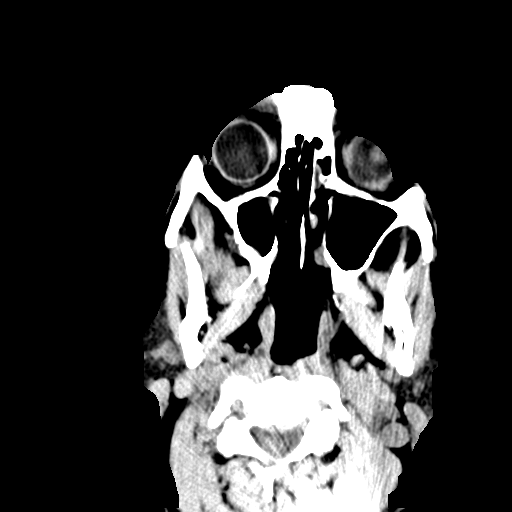
[im 3/37  bone]
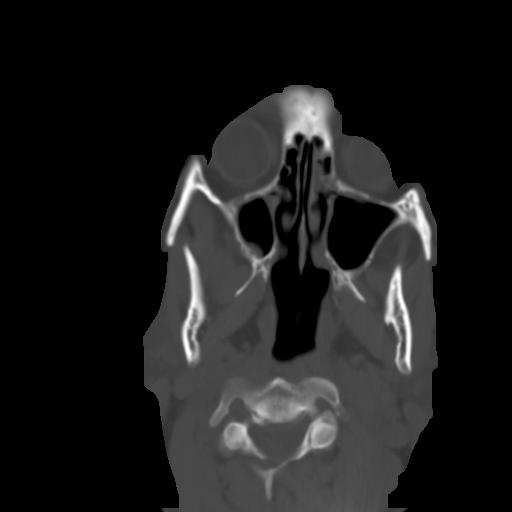
[im 7/37  brain]
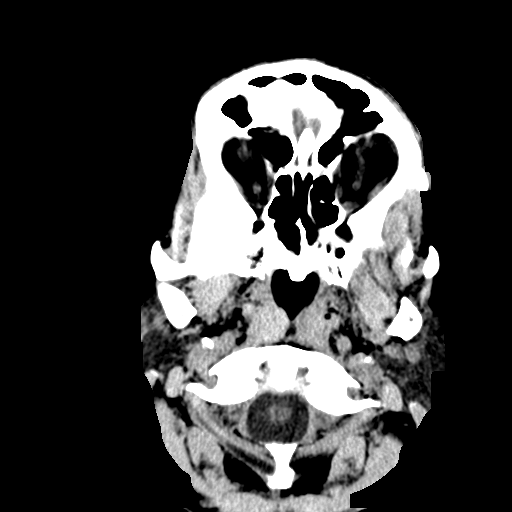
[im 10/37  brain]
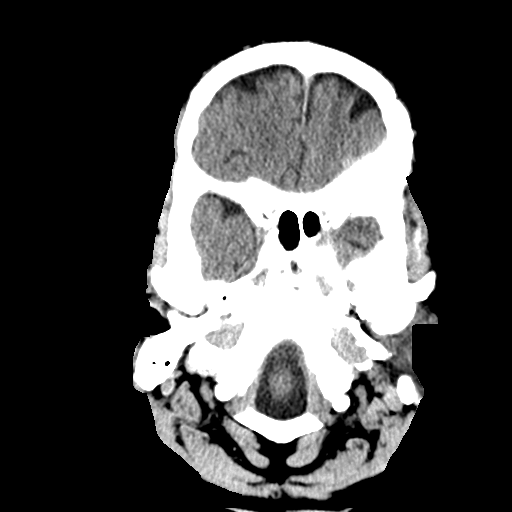
[im 14/37  brain]
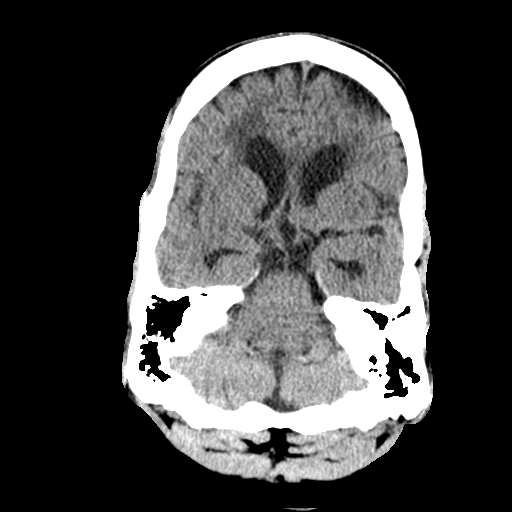
[im 19/37  brain]
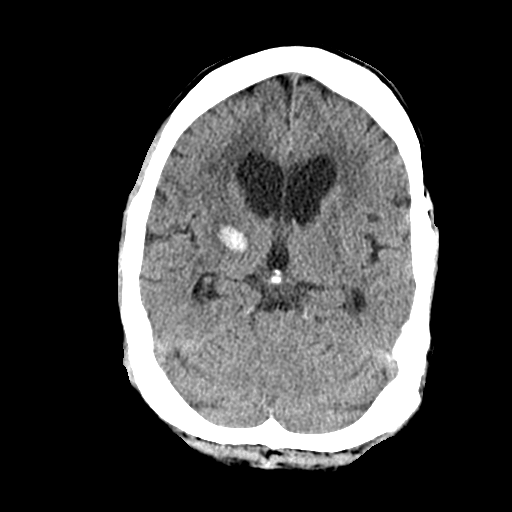
[im 19/37  bone]
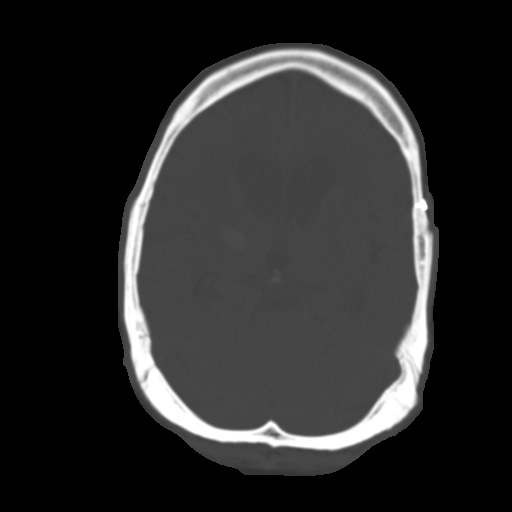
[im 23/37  brain]
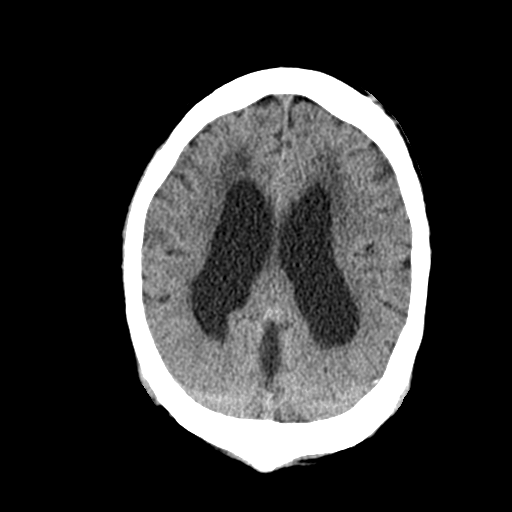
[im 27/37  brain]
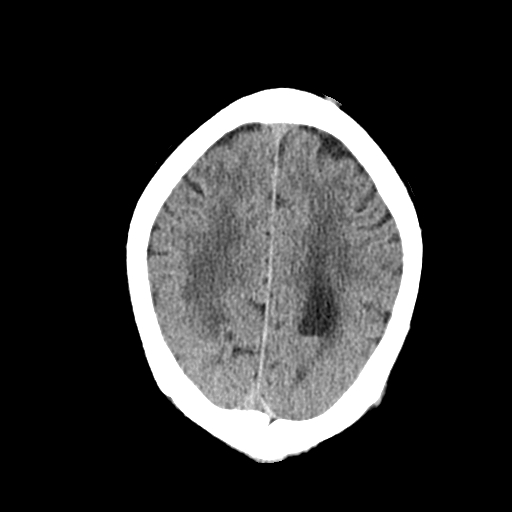
[im 30/37  brain]
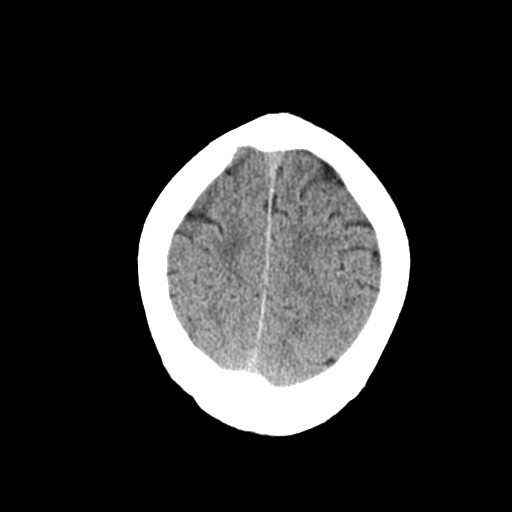
[im 34/37  brain]
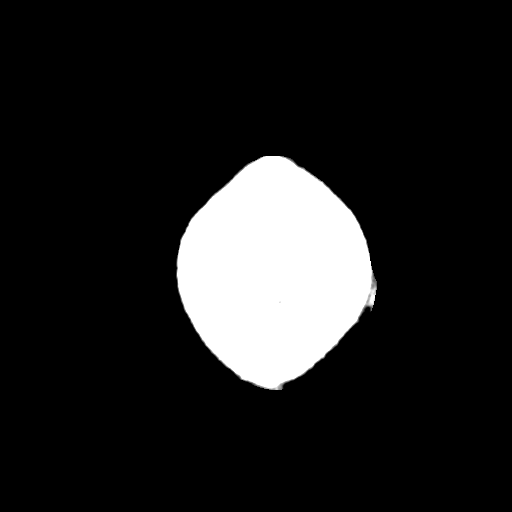
[im 34/37  bone]
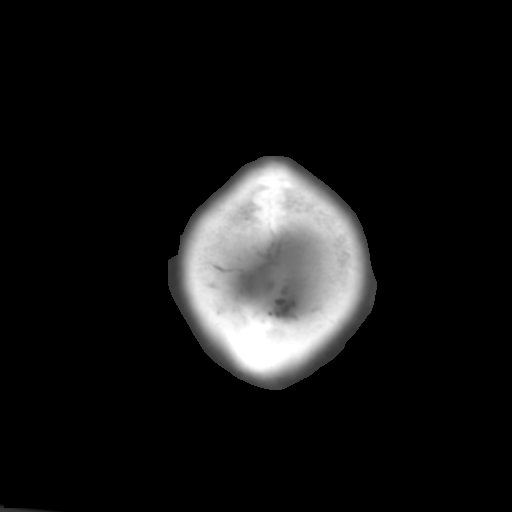

[Series 5: cor soft · coronal · 0.36mm/px · 3 of 74 slices shown]
[im 28/74  brain]
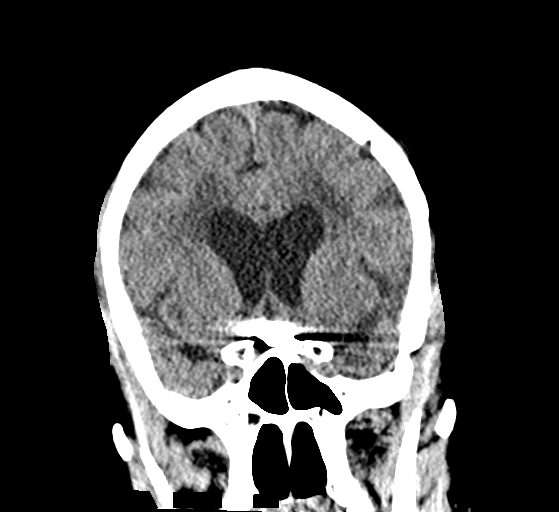
[im 34/74  brain]
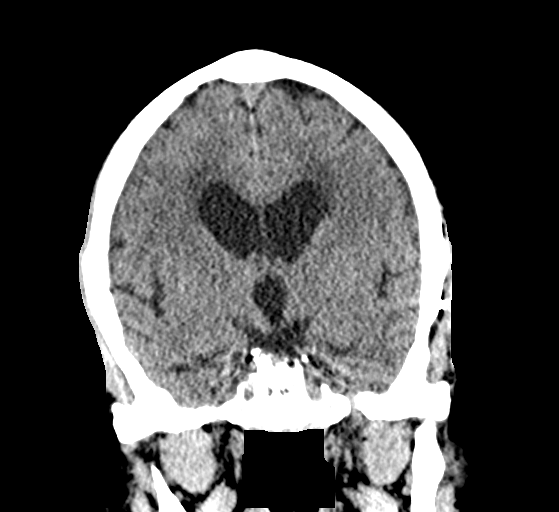
[im 40/74  brain]
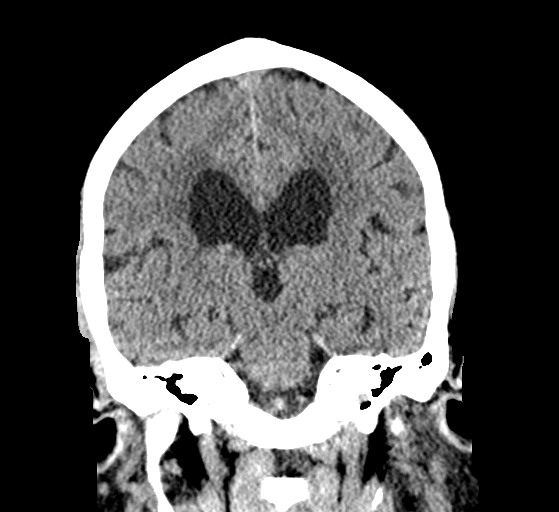

[Series 6: sag soft · sagittal · 0.36mm/px · 3 of 60 slices shown]
[im 20/60  brain]
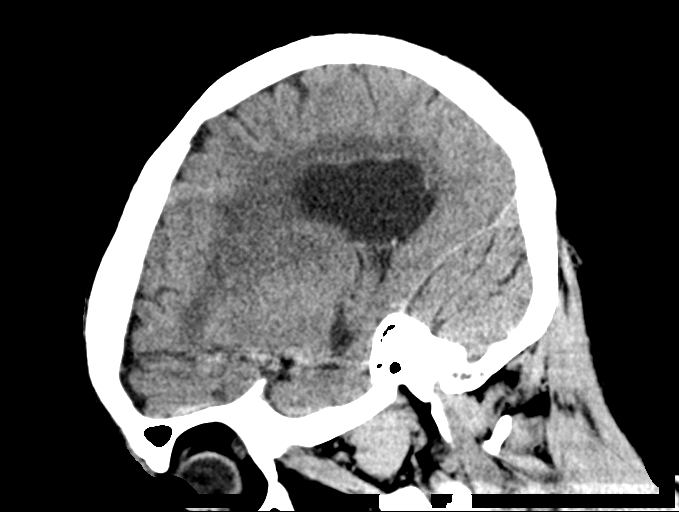
[im 30/60  brain]
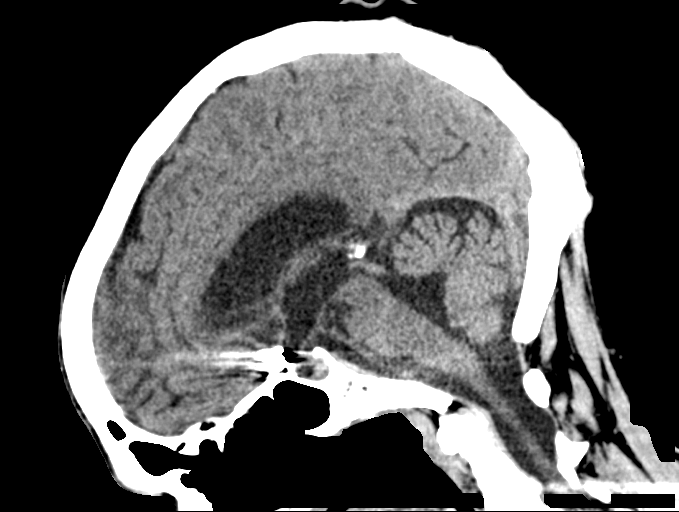
[im 40/60  brain]
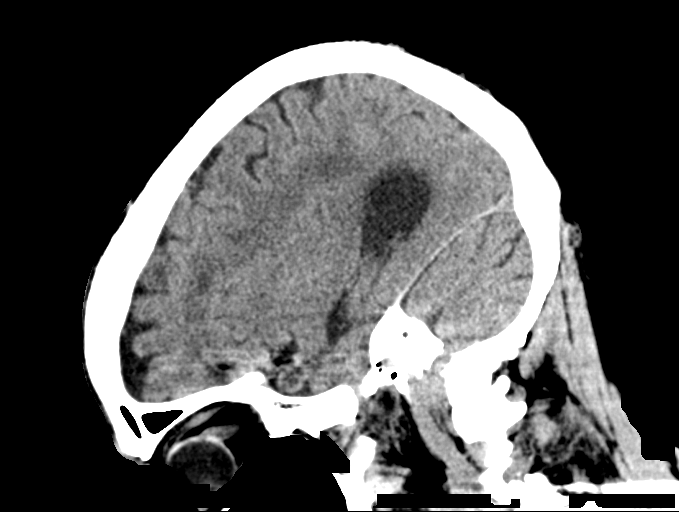

[15 of 47 positions shown; findings below may reference images not displayed]

FINDINGS: Brain: There is a small intraparenchymal hemorrhage in the right
basal ganglia measuring 1.5 x 0.8 cm. There is diffuse volume loss
of the brain periventricular white matter hypoattenuation.

Vascular: Aneurysm clip in the expected location of the anterior
communicating artery.

Skull: Remote left-sided craniotomy.

Sinuses/Orbits: No fluid levels or advanced mucosal thickening of
the visualized paranasal sinuses. No mastoid or middle ear effusion.
The orbits are normal.
IMPRESSION: 1. Small intraparenchymal hemorrhage in the right basal ganglia,
likely hypertensive.
2. Critical Value/emergent results were called by telephone at the
time of interpretation on 07/16/2020 at [DATE] to provider DPODJAN
BETSHOBAKAE , who verbally acknowledged these results.

## 2023-11-15 ENCOUNTER — Other Ambulatory Visit: Payer: Self-pay

## 2023-11-15 ENCOUNTER — Encounter (HOSPITAL_BASED_OUTPATIENT_CLINIC_OR_DEPARTMENT_OTHER): Payer: Self-pay | Admitting: Emergency Medicine

## 2023-11-15 ENCOUNTER — Emergency Department (HOSPITAL_COMMUNITY)

## 2023-11-15 ENCOUNTER — Emergency Department (HOSPITAL_BASED_OUTPATIENT_CLINIC_OR_DEPARTMENT_OTHER)

## 2023-11-15 ENCOUNTER — Encounter (HOSPITAL_BASED_OUTPATIENT_CLINIC_OR_DEPARTMENT_OTHER): Payer: Self-pay

## 2023-11-15 ENCOUNTER — Emergency Department (HOSPITAL_BASED_OUTPATIENT_CLINIC_OR_DEPARTMENT_OTHER)
Admission: EM | Admit: 2023-11-15 | Discharge: 2023-11-15 | Disposition: A | Discharge status: Home / Self Care | Attending: Emergency Medicine | Admitting: Emergency Medicine

## 2023-11-15 DIAGNOSIS — Z992 Dependence on renal dialysis: Secondary | ICD-10-CM | POA: Insufficient documentation

## 2023-11-15 DIAGNOSIS — M25512 Pain in left shoulder: Secondary | ICD-10-CM | POA: Diagnosis present

## 2023-11-15 DIAGNOSIS — N186 End stage renal disease: Secondary | ICD-10-CM | POA: Diagnosis not present

## 2023-11-15 DIAGNOSIS — I10 Essential (primary) hypertension: Secondary | ICD-10-CM | POA: Diagnosis not present

## 2023-11-15 DIAGNOSIS — R200 Anesthesia of skin: Secondary | ICD-10-CM | POA: Insufficient documentation

## 2023-11-15 DIAGNOSIS — Z79899 Other long term (current) drug therapy: Secondary | ICD-10-CM | POA: Diagnosis not present

## 2023-11-15 DIAGNOSIS — F419 Anxiety disorder, unspecified: Secondary | ICD-10-CM | POA: Insufficient documentation

## 2023-11-15 DIAGNOSIS — M79602 Pain in left arm: Secondary | ICD-10-CM | POA: Diagnosis not present

## 2023-11-15 DIAGNOSIS — Z743 Need for continuous supervision: Secondary | ICD-10-CM | POA: Diagnosis not present

## 2023-11-15 DIAGNOSIS — Z7982 Long term (current) use of aspirin: Secondary | ICD-10-CM | POA: Diagnosis not present

## 2023-11-15 DIAGNOSIS — M79606 Pain in leg, unspecified: Secondary | ICD-10-CM | POA: Diagnosis not present

## 2023-11-15 DIAGNOSIS — I120 Hypertensive chronic kidney disease with stage 5 chronic kidney disease or end stage renal disease: Secondary | ICD-10-CM | POA: Insufficient documentation

## 2023-11-15 DIAGNOSIS — R531 Weakness: Secondary | ICD-10-CM | POA: Insufficient documentation

## 2023-11-15 HISTORY — DX: Disorder of kidney and ureter, unspecified: N28.9

## 2023-11-15 HISTORY — DX: Cerebral infarction, unspecified: I63.9

## 2023-11-15 LAB — COMPREHENSIVE METABOLIC PANEL WITH GFR
ALT: 22 U/L (ref 0–44)
AST: 29 U/L (ref 15–41)
Albumin: 3.7 g/dL (ref 3.5–5.0)
Alkaline Phosphatase: 64 U/L (ref 38–126)
Anion gap: 11 (ref 5–15)
BUN: 46 mg/dL — ABNORMAL HIGH (ref 8–23)
CO2: 20 mmol/L — ABNORMAL LOW (ref 22–32)
Calcium: 8.8 mg/dL — ABNORMAL LOW (ref 8.9–10.3)
Chloride: 114 mmol/L — ABNORMAL HIGH (ref 98–111)
Creatinine, Ser: 5.02 mg/dL — ABNORMAL HIGH (ref 0.61–1.24)
GFR, Estimated: 12 mL/min — ABNORMAL LOW (ref >60)
Glucose, Bld: 97 mg/dL (ref 70–99)
Potassium: 4.9 mmol/L (ref 3.5–5.1)
Sodium: 145 mmol/L (ref 135–145)
Total Bilirubin: 0.5 mg/dL (ref 0.0–1.2)
Total Protein: 6.8 g/dL (ref 6.5–8.1)

## 2023-11-15 LAB — DIFFERENTIAL
Abs Immature Granulocytes: 0.01 10*3/uL (ref 0.00–0.07)
Basophils Absolute: 0.1 10*3/uL (ref 0.0–0.1)
Basophils Relative: 1 %
Eosinophils Absolute: 0.3 10*3/uL (ref 0.0–0.5)
Eosinophils Relative: 5 %
Immature Granulocytes: 0 %
Lymphocytes Relative: 36 %
Lymphs Abs: 2.4 10*3/uL (ref 0.7–4.0)
Monocytes Absolute: 0.6 10*3/uL (ref 0.1–1.0)
Monocytes Relative: 9 %
Neutro Abs: 3.2 10*3/uL (ref 1.7–7.7)
Neutrophils Relative %: 49 %

## 2023-11-15 LAB — CBC
HCT: 32 % — ABNORMAL LOW (ref 39.0–52.0)
Hemoglobin: 10.6 g/dL — ABNORMAL LOW (ref 13.0–17.0)
MCH: 33 pg (ref 26.0–34.0)
MCHC: 33.1 g/dL (ref 30.0–36.0)
MCV: 99.7 fL (ref 80.0–100.0)
Platelets: 210 10*3/uL (ref 150–400)
RBC: 3.21 MIL/uL — ABNORMAL LOW (ref 4.22–5.81)
RDW: 11.9 % (ref 11.5–15.5)
WBC: 6.6 10*3/uL (ref 4.0–10.5)
nRBC: 0 % (ref 0.0–0.2)

## 2023-11-15 LAB — PROTIME-INR
INR: 1.1 (ref 0.8–1.2)
Prothrombin Time: 14.2 s (ref 11.4–15.2)

## 2023-11-15 LAB — ETHANOL: Alcohol, Ethyl (B): <15 mg/dL (ref <15)

## 2023-11-15 LAB — CBG MONITORING, ED: Glucose-Capillary: 105 mg/dL — ABNORMAL HIGH (ref 70–99)

## 2023-11-15 LAB — APTT: aPTT: 42 s — ABNORMAL HIGH (ref 24–36)

## 2023-11-15 MED ORDER — SODIUM CHLORIDE 0.9% FLUSH
3.0000 mL | Freq: Once | INTRAVENOUS | Status: DC
  Filled 2023-11-15: qty 3

## 2023-11-15 MED ORDER — FENTANYL CITRATE PF 50 MCG/ML IJ SOSY
50.0000 ug | PREFILLED_SYRINGE | Freq: Once | INTRAMUSCULAR | Status: AC
  Administered 2023-11-15: 50 ug via INTRAVENOUS
  Filled 2023-11-15: qty 1

## 2023-11-15 NOTE — ED Notes (Signed)
 Pt. Just returned from Radiology.  Will go back to Pt. Room to see the pt. And assess.

## 2023-11-15 NOTE — ED Notes (Signed)
 Patient transported to MRI

## 2023-11-15 NOTE — ED Notes (Signed)
 Spoke with Tequila @ [facility] regarding transfer to [facility] ED for MRI

## 2023-11-15 NOTE — ED Triage Notes (Signed)
 Pt is transfer from [facility]. Left sided weakness from previous stroke, new decreased sensation on left arm. Pt is here for MRI.

## 2023-11-15 NOTE — ED Notes (Signed)
 Dr Doretta Gant paged for Neurology on call @ [phone]

## 2023-11-15 NOTE — ED Notes (Signed)
 Pt family member came back to pt and was very frustrated that pt was not in a room. Pt then stated he did not want to stay any longer and signed out AMA. Pt family member started to wheel pt out before pt every mentioned he wanted to leave.

## 2023-11-15 NOTE — Discharge Instructions (Signed)
 Please go to [facility] emergency room for your MRI of the brain.  Dr. Drury Geralds is the accepting physician

## 2023-11-15 NOTE — ED Triage Notes (Addendum)
 Pt reports pain on right  side that started having some pain in left arm and legs. States that he had a stroke 2 years ago and his left side was affected. Family at bedside. Provider at bedside.   Patient reports that he has some loss of sensation in upper left arm.

## 2023-11-15 NOTE — ED Provider Notes (Signed)
 [place] [facility] Provider Note   CSN: [id number] Arrival date & time: 11/15/23  1030     Patient presents with: left sided weakness   Samuel [name] is a 73 y.o. male with history of hypertension, end-stage renal disease on hemodialysis, right basal ganglia intracranial hemorrhage with residual left sided weakness, presents with concern for pain in left shoulder as well as numbness to the left shoulder that he noticed this morning when waking up.  He did not notice this last night before he went to bed.  Last known well yesterday 11/15/23 at 10 PM. He does have residual left sided deficits from prior stroke and is uncertain if he is having any new weakness in the upper or lower extremities.   HPI     Prior to Admission medications   Medication Sig Start Date End Date Taking? Authorizing Provider  amLODipine  (NORVASC ) 10 MG tablet Take 10 mg by mouth daily. 06/16/20   [provider]  aspirin 325 MG tablet Take 325 mg by mouth daily. 08/18/20   [provider]  hydrALAZINE  (APRESOLINE ) 100 MG tablet Take 1 tablet (100 mg total) by mouth every 8 (eight) hours. 07/22/20   Consuelo Denmark, MD  labetalol  (NORMODYNE ) 300 MG tablet Take 1 tablet (300 mg total) by mouth 3 (three) times daily. 07/22/20   Consuelo Denmark, MD    Allergies: Patient has no known allergies.    Review of Systems  Musculoskeletal:        Left shoulder pain    Updated Vital Signs BP (!) 145/108 (BP Location: Left Arm)   Pulse 60   Temp 97.9 F (36.6 C) (Oral)   Resp (!) 21   Ht 6' 5 (1.956 m)   Wt 85.3 kg   SpO2 100%   BMI 22.29 kg/m   Physical Exam Vitals and nursing note reviewed.  Constitutional:      General: He is not in acute distress.    Appearance: He is well-developed.     Comments: Very anxious appearing, tearful  HENT:     Head: Normocephalic and atraumatic.   Eyes:     Extraocular Movements: Extraocular movements intact.      Conjunctiva/sclera: Conjunctivae normal.     Pupils: Pupils are equal, round, and reactive to light.    Cardiovascular:     Rate and Rhythm: Normal rate and regular rhythm.     Heart sounds: No murmur heard. Pulmonary:     Effort: Pulmonary effort is normal. No respiratory distress.     Breath sounds: Normal breath sounds.  Abdominal:     Palpations: Abdomen is soft.     Tenderness: There is no abdominal tenderness.   Musculoskeletal:        General: No swelling.     Cervical back: Neck supple.     Comments: Difficulty with left shoulder flexion and abduction due to pain   Skin:    General: Skin is warm and dry.     Capillary Refill: Capillary refill takes less than 2 seconds.   Neurological:     Mental Status: He is alert.     Comments: Mental status: Alert and oriented to self, place, and month. Able to name phone and pen appropriately  Speech: Answers questions appropriately  Cranial Nerves: III, IV, VI: EOM intact, Pupils equal round and reactive, no gaze preference or deviation, no nystagmus. V: normal sensation in V1, V2, and V3 segments bilaterally VII: Mild droop in smile on left  side, unknown if this is a baseline for patient.  Puffs cheeks, raises eyebrows, and closes eyes without asymmetry.  VIII: normal hearing to speech IX, X: normal palatal elevation, no uvular deviation XI: 5/5 head turn and 5/5 shoulder shrug bilaterally XII: midline tongue protrusion  Motor: 5/5 strength with hand squeeze bilaterally, ankle plantarflexion and dorsiflexion bilaterally  Sensory: Reports loss of sensation over deltoid muscle of the LUE. Normal sensation to deltoid of RUE. Normal sensation bilaterally of elbows, forearms, and hands  Intact sensation of bilateral lower extremities   Coordination: Normal finger to nose and heel to shin, no tremor, no dysmetria   Psychiatric:        Mood and Affect: Mood normal.     (all labs ordered are listed, but only abnormal  results are displayed) Labs Reviewed  APTT - Abnormal; Notable for the following components:      Result Value   aPTT 42 (*)    All other components within normal limits  CBC - Abnormal; Notable for the following components:   RBC 3.21 (*)    Hemoglobin 10.6 (*)    HCT 32.0 (*)    All other components within normal limits  COMPREHENSIVE METABOLIC PANEL WITH GFR - Abnormal; Notable for the following components:   Chloride 114 (*)    CO2 20 (*)    BUN 46 (*)    Creatinine, Ser 5.02 (*)    Calcium 8.8 (*)    GFR, Estimated 12 (*)    All other components within normal limits  CBG MONITORING, ED - Abnormal; Notable for the following components:   Glucose-Capillary 105 (*)    All other components within normal limits  PROTIME-INR  DIFFERENTIAL  ETHANOL  CBG MONITORING, ED    EKG: EKG Interpretation Date/Time:  Friday November 15 2023 10:34:00 EDT Ventricular Rate:  64 PR Interval:    QRS Duration:  116 QT Interval:  490 QTC Calculation: 506 R Axis:   -49  Text Interpretation: Normal sinus rhythm LAD, consider left anterior fascicular block Interpretation limited secondary to artifact Otherwise no significant change Confirmed by Celesta Coke ([id number]) on 11/15/2023 10:38:15 AM  Radiology: CT HEAD WO CONTRAST Result Date: 11/15/2023 CLINICAL DATA:  Left arm numbness and history of stroke 2 years ago. EXAM: CT HEAD WITHOUT CONTRAST TECHNIQUE: Contiguous axial images were obtained from the base of the skull through the vertex without intravenous contrast. RADIATION DOSE REDUCTION: This exam was performed according to the departmental dose-optimization program which includes automated exposure control, adjustment of the mA and/or kV according to patient size and/or use of iterative reconstruction technique. COMPARISON:  CT head dated September 09, 2020. FINDINGS: Brain: Generalized cerebral volume loss with moderate ventriculomegaly and moderate diffuse periventricular and deep cerebral white  matter disease. No evidence of hemorrhage, mass or acute cortical infarct. Vascular: Surgical clips in the region of the anterior communicating artery. Calcific atheromatous disease within the carotid siphons and left vertebral artery. Skull: Status post left pterional craniotomy. Otherwise, intact and unremarkable. Sinuses/Orbits: Medial bowing of the right medial orbital wall. Mucosal disease within the left ethmoid air cells. Other: None. IMPRESSION: 1. Chronic ventriculomegaly which appears to be secondary to global cerebral volume loss. There is also moderate diffuse cerebral white matter disease. No apparent acute process. Electronically Signed   By: Maribeth Shivers M.D.   On: 11/15/2023 11:57     Procedures   Medications Ordered in the ED  sodium chloride  flush (NS) 0.9 % injection 3 mL (3 mLs  Intravenous Not Given 11/15/23 1104)  fentaNYL (SUBLIMAZE) injection 50 mcg (50 mcg Intravenous Given 11/15/23 1103)    Clinical Course as of 11/15/23 1352  Fri Nov 15, 2023  1043 Patient reporting decreased sensation to left arm, deltoid region. Noticed it this morning when waking up. Last known well last night (11/14/23) at 10pm. Will proceed with stroke order set but no code stroke since he is outside the window [AF]  1237 Pain in shoulder improved with fentanyl. Patient also reports working out heavily 2 days ago, this could be contributing to patient's pain [AF]  1304 Spoke with Dr. Doretta Gant with neurology, recommended MRI brain without contrast for further evaluation.  She states that if this is normal, patient does not need any further evaluation by neuro/admission.  She requested he received a referral for neurology care outpatient. [AF]    Clinical Course User Index [AF] Rexie Catena, PA-C                                 Medical Decision Making Amount and/or Complexity of Data Reviewed Labs: ordered. Radiology: ordered.  Risk Prescription drug management.     Differential  diagnosis includes but is not limited to CVA, hypoglycemia, electrolyte abnormality, musculoskeletal pain, radiculopathy, vascular dissection  ED Course:  Upon initial evaluation, patient is anxious appearing, but no acute distress.  Stable vital signs aside from elevated blood pressure 145/108.  Reporting loss of sensation over the left shoulder by the left deltoid muscle.  No loss of sensation of the left elbow, left forearm, left hand.  Reports normal sensation on the right upper extremity.  Appears he may have slight facial droop on left side when smiling, unknown if this is a baseline for patient his previous stroke.  Otherwise, no other neurodeficits noted at this time.  He is outside the window for code stroke given last known well was last night at 10 PM.  However, will proceed with stroke workup.  Labs Ordered: I Ordered, and personally interpreted labs.  The pertinent results include:   CBC with hemoglobin 10.6 CMP with creatinine elevated at 5.02 which appears to be at baseline.  No significant electrolyte abnormalities.  No elevation in LFTs. CBG 105 upon arrival PT/INR within normal limits APTT elevated 42 Ethanol level undetectable  Imaging Studies ordered: I ordered imaging studies including CT head without contrast, left shoulder x-ray I independently visualized the imaging with scope of interpretation limited to determining acute life threatening conditions related to emergency care.  CT head without acute abnormality Left shoulder x-ray without fracture or dislocation per my interpretation I agree with the radiologist interpretation   Cardiac Monitoring: / EKG: The patient was maintained on a cardiac monitor.  I personally viewed and interpreted the cardiac monitored which showed an underlying rhythm of: Sinus bradycardia, rate in the 50s on monitor in the room   Consultations Obtained: I requested consultation with the neurologist Dr. Doretta Gant,  and discussed lab and  imaging findings as well as pertinent plan - they recommend: Transfer to [facility] for MRI brain without contrast.  She states that if this is unremarkable, patient does not need any further neurology evaluation or admission.  She request that patient have neurology referral sent for him so that he can have outpatient management for previous CVA.  I have sent this referral and ordered MRI brain for patient.  Medications Given: Fentanyl for pain  Upon re-evaluation, patient reports  that pain in left shoulder has improved.  Given improvement with pain medication, question if this is more musculoskeletal in nature.  On reexamination, reports that he can feel my touch to his left shoulder, but it does feel diminished compared to the right side.  No new neurologic deficits.  Discussed that we will be transferring him for MRI as part of stroke workup.  Patient in agreement with plan.    Impression: Left shoulder numbness  Disposition:  Transfer to [facility] emergency room for MRI brain to rule out stroke.  Dr. Drury Geralds accepting patient.   I have placed neurology referral for patient so he can receive care outpatient for previous CVA.    Record Review: External records from outside source obtained and reviewed including  Neurology note from 07/17/21 where patient's residual left-sided deficits were noted.  They also noted gait impairment and need for cane. Also noted left shoulder and knee pain at that time     This chart was dictated using voice recognition software, Dragon. Despite the best efforts of this provider to proofread and correct errors, errors may still occur which can change documentation meaning.       Final diagnoses:  Left sided numbness  Acute pain of left shoulder    ED Discharge Orders          Ordered    Ambulatory referral to Neurology       Comments: An appointment is requested in approximately: 4 weeks   11/15/23 1348               Rexie Catena,  PA-C 11/15/23 1352    Kingsley, Victoria K, DO 11/15/23 1530
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
## 2023-12-09 NOTE — Interval H&P Note (Signed)
 The surgical history has been reviewed and remains accurate without interval change.  The patient was re-examined and patient's physiologic condition has not changed significantly in the last 30 days. The condition still exists that makes this procedure necessary. The treatment plan remains the same, without new options for care.  No new pharmacological allergies or types of therapy has been initiated that would change the plan or the appropriateness of the plan.  The patient and/or family understand the potential benefits and risks.  Had cream with coffee at 5:15 am. Ok to proceed after 6 hrs. Will plan for fistula revision with possible fistulogram.   Samuel Chihuahua, MD Vascular and Endovascular Surgery [phone]

## 2023-12-09 NOTE — Anesthesia Preprocedure Evaluation (Addendum)
 Patient: Samuel Brendlinger Jr.  Procedure Information     Date/Time: 12/09/23 1316   Procedures:      REVISION AV FISTULA (Left: Arm)     Fistulagram arterial venous - will need cath lab team   Location: [facility] OR 01 - HYBRID / [facility] OR   Surgeons: Franky JENEANE Chihuahua, MD       Relevant Problems  CARDIOVASCULAR  (+) Hypertension associated with stage 5 chronic kidney disease due to type 2 diabetes mellitus    (CMD)  (+) Hypertensive crisis    GU  (+) Acute kidney injury (HCC)  (+) Benign hypertension with CKD (chronic kidney disease) stage IV (HCC)  (+) Benign hypertension with chronic kidney disease, stage V    (CMD)  (+) CKD (chronic kidney disease) stage 4, GFR 15-29 ml/min (HCC)  (+) CKD (chronic kidney disease), stage IV (HCC)  (+) Chronic kidney disease, stage 3 (HCC)  (+) Chronic kidney disease, stage 5    (CMD)  (+) Hypertension associated with stage 5 chronic kidney disease due to type 2 diabetes mellitus    (CMD)  (+) Stage 5 chronic kidney disease not on chronic dialysis (HCC)    BP 152/85 Comment: righ arm lying  Pulse 57   Temp 97.1 F (36.2 C) (Skin)   Resp 18   Ht 1.956 m (6' 5)   Wt 81.6 kg (180 lb)   SpO2 100% Comment: room air  BMI 21.34 kg/m    Clinical information reviewed:  Tobacco  Allergies  Meds  Med Hx  Surg Hx  Fam Hx  Soc Hx     Anesthesia Evaluation  Anesthesia History: Patient has no history of anesthetic complications.  PONV Predictive Score (Scale 0-5):  Apfel risk score: 0 Respiratory: Patient has no shortness of breath, no cough/congestion, no chest/airway infection and no pneumonia. Additional comments: Hx tobacco abuse  Cardiovascular: Patient has high blood pressure. Patient has no angina/chest pain, no CHF related dyspnea/SOB, no orthopnea, no palpitations and no dysrhythmias.  Neurological: Patient has CVA. Patient has no seizures.  Renal/Gastrointestinal: Patient has no GERD, no nausea and no vomiting.  Genitourinary: Patient  has end-stage renal disease and dialysis.  Endocrine: Patient has type 2 diabetes.  Hematology/Oncology: Patient has anemia.     Physical Exam  Airway  Mallampati: II TM Distance (FB): 3 Oral Aperture (FB): 3 Neck ROM: full ROM Cardiovascular - normal exam Rhythm: regular Rate: normal no murmur, no friction rub and no JVD Dental  edentulous Pulmonary - normal exam breath sounds clear to auscultation Neurological  Neurological: alert and alert and oriented to person, place, time, and situation Functional Status  Exercise Tolerance: >4 METS   Anesthesia Plan  Review Preop documentation reviewed: H&P reviewed, NPO Status Reviewed, Preop Vitals Reviewed, Previous Anesthesia Records Reviewed and Periop Tests and Results Reviewed Comments: After discussion with surgeon and patient, given that revision fistula will be so proximal in the axilla, decision made to proceed with general anesthesia. Consented for post op analgesic block as needed.  By signing, I attest that I have identified and re-evaluated the patient immediately before the induction of anesthesia and I am satisfied that my anesthetic plan is suitable for the patient's condition and procedure.  Any physical exam findings recorded in this note have been performed and confirmed by me.  Other providers may have contributed to writing this note.  I attest that I have confirmed and agree with all elements of this note unless I have specified otherwise.  Please  see Revision History to see contributions entered by other providers.   Plan ASA score: 3  Anesthesia type: general Induction: intravenous Post-op plan: Extubation in OR and PACU  Informed Consent Anesthetic plan and risks discussed with patient. Use of blood products discussed with patient whom consented to blood products. Plan discussed with CRNA.   Date of Last Liquid: 12/09/23 Time of last liquid: 0515 (8 oz coffee and ceamer) Date of Last Solid:  12/08/23 Time of last solid: 1900

## 2023-12-09 NOTE — Anesthesia Procedure Notes (Signed)
  Airway Date/Time: 12/09/2023 3:30 PM Reason: elective  Airway not difficult  General Information and Staff Patient location during procedure: OR Performed: CRNA   Indications and Patient Condition Indications for airway management: anesthesia Sedation level: deep    Preoxygenated: yesPatient position: sniffing Rapid sequence: no Cricoid pressure: no MILS not maintained throughout  Mask difficulty assessment: 0 - not attempted  Final Airway Details  Final airway type: supraglottic airway Successful airway: classic Size: 5 Airway Seal Pressure (cm H2O): 10 Number of attempts at approach: 1 Number of other approaches attempted: 0 Final Dental Condition: Teeth, lips, and tongue in pre-anesthetic condition Patient tolerated procedure well with no complications  Additional Comments No mask ventilation attempted.  LMA inserted x 1 attempt, atraumatic.  leak at 10 cm H2O.

## 2023-12-10 NOTE — Telephone Encounter (Signed)
 Hello, would you please make sure that [name] has a follow-up with Dr. Tina in 3 weeks with a left upper extremity duplex (ordered, we placed the graft 7/14).  Thank you so much, Nancyann

## 2024-01-01 NOTE — Progress Notes (Signed)
 [facility] Outpatient Return Visit    History of Present Illness:  [name] presents for follow up of his HTN, previous AKI, stage V CKD and metabolic acidosis.  He had the 2nd stage of his left arm AFV surgery on 12/09/2023. He complains of his arm still being swollen.  He feels okay. Appetite is good. No nausea, vomiting or odd taste in the mouth. Weight is stable.  He has chronic SOB.   He does not check his home BP.  His most recent BUN/Creat was 44/ 5.13 mg/dl on 05/31/7972. K+ was 5.3. CO2 20. Hgb 9.3 g/dl. He has not been taking his Sodium bicarbonate  tabs.    Review of Systems A complete ROS was performed with pertinent positives/negatives noted in the HPI. The remainder of the ROS are negative.    Past History:    Active Ambulatory Problems    Diagnosis Date Noted  . Chronic kidney disease, stage 3 (HCC) 08/19/2017  . Benign hypertension with CKD (chronic kidney disease) stage IV (HCC) 08/26/2017  . CKD (chronic kidney disease) stage 4, GFR 15-29 ml/min (HCC) 08/26/2017  . Acute kidney injury (HCC) 08/26/2017  . Drug-induced erectile dysfunction 09/25/2017  . Tobacco abuse 04/14/2018  . Stage 5 chronic kidney disease not on chronic dialysis (HCC) 10/11/2020  . Hemiparesis of left nondominant side as late effect of cerebrovascular disease (HCC) 10/11/2020  . Benign hypertension with chronic kidney disease, stage V    (CMD) 01/18/2022  . Normal anion gap metabolic acidosis 01/18/2022  . Anemia in stage 5 chronic kidney disease, not on chronic dialysis (HCC) 04/25/2022  . ICH (intracerebral hemorrhage) (HCC) 07/16/2020  . CKD (chronic kidney disease), stage IV (HCC) 05/01/2022  . Hypertensive crisis 05/01/2022  . Chronic kidney disease, stage 5    (CMD) 11/09/2022  . Hypertension associated with stage 5 chronic kidney disease due to type 2 diabetes mellitus    (CMD) 12/11/2022   Resolved Ambulatory Problems    Diagnosis Date Noted  . Hypertensive  urgency 08/19/2017  . Accelerated hypertension 08/13/2018  . Hypertension following surgery 12/10/2022   Past Medical History:  Diagnosis Date  . Ambulates with cane   . Benign hypertension with CKD (chronic kidney disease) stage V    (CMD) 01/18/2022  . Depression   . Hypertension   . Renal disorder   . Stroke    (CMD)   . Wears dentures    Family History  Problem Relation Name Age of Onset  . Cancer Mother    . Hypertension Father    . Diabetes Father    . Stroke Father    . Diabetes Maternal Aunt    . Anesthesia problems Neg Hx     Social History Social History   Tobacco Use  . Smoking status: Former    Current packs/day: 0.00    Types: Cigarettes    Quit date: 07/26/2017    Years since quitting: 6.4  . Smokeless tobacco: Never  Substance Use Topics  . Alcohol use: No  . Drug use: No   Social History   Social History Narrative  . Not on file     Medications:   Meds Ordered in [facility]  Medication Sig Dispense Refill  . amLODIPine  (NORVASC ) 10 mg tablet TAKE 1 TABLET(10 MG) BY MOUTH DAILY 30 tablet 2  . aspirin  325 mg tablet Take 325 mg by mouth daily.    . benzocaine-menthoL (Cepacol Instamax Sore Throat) 15-20 mg lozg Take 1 lozenge by mouth 4 (four) times  a day as needed. 16 lozenge 0  . cloNIDine  (CATAPRES ) 0.1 mg tablet Take 0.1 mg by mouth daily. 1 tablet three times per day    . hydrALAZINE  (APRESOLINE ) 100 mg tablet Take 100 mg by mouth 2 (two) times a day. Indications: high blood pressure 60 tablet 5  . labetaloL  (NORMODYNE ) 300 mg tablet TAKE 1 TABLET(300 MG) BY MOUTH EVERY 12 HOURS 60 tablet 5  . Linzess 145 mcg cap capsule 145 mcg daily as needed.    . lisinopriL  (PRINIVIL ) 10 mg tablet Take 10 mg by mouth daily.    . sodium bicarbonate  650 mg tablet Take 650 mg by mouth 2 (two) times a day. 60 tablet 5   Current Facility-Administered Medications Ordered in Epic  Medication Dose Route Frequency Provider Last Rate Last Admin  . epoetin alfa  injection 20,000 Units  20,000 Units subcutaneous Q14 Days Ikechukwu Onyekachi Nwobu, MD   20,000 Units at 01/01/24 1119      Physical Exam:   Vitals:   01/01/24 1039  BP: 132/70  Patient Position: Sitting  Pulse: 62  Weight: 82.6 kg (182 lb)      General appearance: alert, appears stated age, and cooperative  Head: Normocephalic, without obvious abnormality, atraumatic ENT: Normal. No mucosal discharge. Lungs: CTA bilaterally. No rales or wheeze. Heart: regular rate and rhythm, S1, S2 normal, no murmur, click, rub or gallop Abdomen: soft, non-tender; bowel sounds normal; no masses,  no organomegaly Extremities:  No cyanosis. No lower extremity edema. Left arm with swelling.  AVF with thrill and bruit. Neurologic: Alert and oriented X 3, normal strength and tone. Normal symmetric reflexes. Normal coordination and gait Psych: Normal mood and affect.  Lab on 12/31/2023  Component Date Value Ref Range Status  . Sodium 12/31/2023 144  136 - 145 mmol/L Final  . Potassium 12/31/2023 5.3 (H)  3.5 - 5.1 mmol/L Final   NO VISIBLE HEMOLYSIS  . Chloride 12/31/2023 117 (H)  98 - 107 mmol/L Final  . CO2 12/31/2023 20 (L)  21 - 31 mmol/L Final  . Anion Gap 12/31/2023 7  6 - 14 mmol/L Final  . Glucose, Random 12/31/2023 88  70 - 99 mg/dL Final  . Blood Urea Nitrogen (BUN) 12/31/2023 44 (H)  7 - 25 mg/dL Final  . Creatinine 91/94/7974 5.13 (H)  0.70 - 1.30 mg/dL Final  . eGFR 91/94/7974 11 (L)  >59 mL/min/1.61m2 Final   GFR estimated by CKD-EPI equations(NKF 2021).   Recommend confirmation of Cr-based eGFR by using Cys-based eGFR and other filtration markers (if applicable) in complex cases and clinical decision-making, as needed.  . Albumin 12/31/2023 3.5  3.5 - 5.7 g/dL Final  . Calcium 91/94/7974 8.6  8.6 - 10.3 mg/dL Final  . Phosphorus 91/94/7974 4.6  2.5 - 5.0 mg/dL Final  . BUN/Creatinine Ratio 12/31/2023 8.6 (L)  10.0 - 20.0 Final   Potential intrarenal damage  . Vitamin D  25-Hydroxy 12/31/2023 31.1  30.0 - 100.0 ng/mL Final   Deficient: <20 ng/mL  Insufficient: 20-30 ng/mL  Sufficient: 30-100 ng/mL  Upper Safety Limit/Toxicity: >100 ng/mL   . WBC 12/31/2023 5.70  4.40 - 11.00 10*3/uL Final  . RBC 12/31/2023 2.85 (L)  4.50 - 5.90 10*6/uL Final  . Hemoglobin 12/31/2023 9.3 (L)  14.0 - 17.5 g/dL Final  . Hematocrit 91/94/7974 28.6 (L)  41.5 - 50.4 % Final  . Mean Corpuscular Volume (MCV) 12/31/2023 100.4 (H)  80.0 - 96.0 fL Final  . Mean Corpuscular Hemoglobin [facility]) 12/31/2023  32.5  27.5 - 33.2 pg Final  . Mean Corpuscular Hemoglobin Conc (* 12/31/2023 32.4 (L)  33.0 - 37.0 g/dL Final  . Red Cell Distribution Width (RDW) 12/31/2023 13.8  12.3 - 17.0 % Final  . Platelet Count (PLT) 12/31/2023 214  150 - 450 10*3/uL Final  . Mean Platelet Volume (MPV) 12/31/2023 7.8  6.8 - 10.2 fL Final      Assessment.   Samuel [name] is a 73 year old man with HTN, previous hospitalization for hypertensive emergency, intracerebral hemorrhage with residual left hemiparesis and progressive stage V CKD who presents for follow up. He underwent the first stage of a left brachio-basilic AVF creation on 12/10/22 and second stage on 12/09/2022.  His BP is well controlled but he has worsening anemia. No overt uremic symptoms.  I will start  Epogen for his anemia management, and have advised him to call or go to the ED if he begins to feel unwell.    1. Benign hypertension with chronic kidney disease, stage V    (CMD)      2. Anemia, unspecified type  Anemia Profile    3. Anemia in stage 5 chronic kidney disease, not on chronic dialysis    (CMD)  epoetin alfa injection 20,000 Units    4. Chronic kidney disease, stage 5    (CMD)  CBC without Differential   Renal Function Panel   Vitamin D, 25-Hydroxy    5. Hemiparesis of left nondominant side as late effect of nontraumatic intracerebral hemorrhage    (CMD)      6. Hyperkalemia      7. Normal anion gap metabolic acidosis             Plan.  1) Start Epogen 20,000 units subcutaneous q 2 weeks. First dose today. 2) Continue other regimen. Advised to resume PO Sodium bicarbonate  for acidosis and hyperkalemia management. 3) Low salt diet and exercise. 4) Increase water intake. 5) Monitor and record home BP. 6) Advised to call or go to the ED if he begins to feel unwell. 7) Repeat labs before next visit.    Return in about 3 months (around 04/02/2024).   Eulas Manes  MD, [facility], [facility] 01/01/2024 12:36 PM

## 2024-01-03 NOTE — Progress Notes (Signed)
 VASCULAR SURGERY POST OPERATIVE NOTE  01/03/2024 Chief Complaint  Patient presents with  . Post-op      HPI   Samuel [name]. is a 73 y.o. male who comes in today for follow-up evaluation of permanent dialysis access, status post left brachioaxillary AV graft placement by Dr. Tina on 12/09/2023 with ligation of existing fistula at that time. Patient is doing well with no concerning complaints but is experiencing swelling in the hand and arm. Patient denies pain, weakness, and numbness in the left hand. He is not yet on dialysis and sees his nephrologist in November.     Physical exam   General: No acute distress, well-nourished, well-developed male   Extremity: Incision at left upper arm well-healed.  No erythema, warmth, or drainage.   Strength 5 out of 5 in the left hand.  2+ left radial pulse.   Moderate edema of the left upper extremity.   Palpable thrill in the AV graft  Diagnostic imaging: Duplex shows patent graft with adequate flow volume and diameter. Depth appropriate.    Plan   Patient doing well s/p dialysis access creation. Reassured him that his swelling is normal and expected after having a graft placed. No signs of infection. Recommend compression and elevation of his arm daily for management of edema.  OK to use graft for dialysis when needed. In the meantime, continue to exercise hand with a stress ball or towel.  Continue to follow-up with nephrology. Patient knows to call with questions or concerns.  Electronically signed by: Leita MARLA Share, PA-C 01/03/2024 2:25 PM

## 2024-01-29 NOTE — Progress Notes (Addendum)
 Patient here for Procrit 20,000 units/mL. He tolerated injection well.   Procedure was supervised by me.

## 2024-02-13 NOTE — Progress Notes (Signed)
 Patient did not receive Procrit 02/12/24 due to elevated blood pressure. He will return next Monday 02/17/24.

## 2024-02-17 NOTE — Progress Notes (Addendum)
 Patient here for Epogen 20,000 units/mL. He tolerated injection well.  Procedure was supervised by me.

## 2024-02-18 ENCOUNTER — Encounter (HOSPITAL_BASED_OUTPATIENT_CLINIC_OR_DEPARTMENT_OTHER): Payer: Self-pay

## 2024-02-18 ENCOUNTER — Inpatient Hospital Stay (HOSPITAL_BASED_OUTPATIENT_CLINIC_OR_DEPARTMENT_OTHER)
Admission: EM | Admit: 2024-02-18 | Discharge: 2024-02-22 | DRG: 291 | Disposition: A | Discharge status: Home / Self Care | Attending: Internal Medicine | Admitting: Internal Medicine

## 2024-02-18 ENCOUNTER — Emergency Department (HOSPITAL_BASED_OUTPATIENT_CLINIC_OR_DEPARTMENT_OTHER)

## 2024-02-18 ENCOUNTER — Other Ambulatory Visit: Payer: Self-pay

## 2024-02-18 DIAGNOSIS — I132 Hypertensive heart and chronic kidney disease with heart failure and with stage 5 chronic kidney disease, or end stage renal disease: Principal | ICD-10-CM | POA: Diagnosis present

## 2024-02-18 DIAGNOSIS — G8929 Other chronic pain: Secondary | ICD-10-CM | POA: Diagnosis present

## 2024-02-18 DIAGNOSIS — R0602 Shortness of breath: Secondary | ICD-10-CM | POA: Diagnosis present

## 2024-02-18 DIAGNOSIS — I5033 Acute on chronic diastolic (congestive) heart failure: Secondary | ICD-10-CM | POA: Diagnosis present

## 2024-02-18 DIAGNOSIS — M25512 Pain in left shoulder: Principal | ICD-10-CM

## 2024-02-18 DIAGNOSIS — I444 Left anterior fascicular block: Secondary | ICD-10-CM | POA: Diagnosis present

## 2024-02-18 DIAGNOSIS — N189 Chronic kidney disease, unspecified: Secondary | ICD-10-CM

## 2024-02-18 DIAGNOSIS — M19012 Primary osteoarthritis, left shoulder: Secondary | ICD-10-CM | POA: Diagnosis present

## 2024-02-18 DIAGNOSIS — E119 Type 2 diabetes mellitus without complications: Secondary | ICD-10-CM

## 2024-02-18 DIAGNOSIS — E1122 Type 2 diabetes mellitus with diabetic chronic kidney disease: Secondary | ICD-10-CM | POA: Diagnosis present

## 2024-02-18 DIAGNOSIS — Z7982 Long term (current) use of aspirin: Secondary | ICD-10-CM

## 2024-02-18 DIAGNOSIS — I10 Essential (primary) hypertension: Secondary | ICD-10-CM | POA: Diagnosis present

## 2024-02-18 DIAGNOSIS — Z23 Encounter for immunization: Secondary | ICD-10-CM

## 2024-02-18 DIAGNOSIS — Z79899 Other long term (current) drug therapy: Secondary | ICD-10-CM

## 2024-02-18 DIAGNOSIS — D631 Anemia in chronic kidney disease: Secondary | ICD-10-CM | POA: Diagnosis present

## 2024-02-18 DIAGNOSIS — I509 Heart failure, unspecified: Secondary | ICD-10-CM | POA: Diagnosis not present

## 2024-02-18 DIAGNOSIS — F17210 Nicotine dependence, cigarettes, uncomplicated: Secondary | ICD-10-CM | POA: Diagnosis present

## 2024-02-18 DIAGNOSIS — R7989 Other specified abnormal findings of blood chemistry: Secondary | ICD-10-CM | POA: Diagnosis present

## 2024-02-18 DIAGNOSIS — Z862 Personal history of diseases of the blood and blood-forming organs and certain disorders involving the immune mechanism: Secondary | ICD-10-CM

## 2024-02-18 DIAGNOSIS — N185 Chronic kidney disease, stage 5: Secondary | ICD-10-CM | POA: Diagnosis not present

## 2024-02-18 DIAGNOSIS — N179 Acute kidney failure, unspecified: Secondary | ICD-10-CM | POA: Diagnosis not present

## 2024-02-18 DIAGNOSIS — I69354 Hemiplegia and hemiparesis following cerebral infarction affecting left non-dominant side: Secondary | ICD-10-CM

## 2024-02-18 HISTORY — DX: Heart failure, unspecified: I50.9

## 2024-02-18 HISTORY — DX: Type 2 diabetes mellitus without complications: E11.9

## 2024-02-18 HISTORY — DX: Chronic kidney disease, stage 5: N18.5

## 2024-02-18 LAB — CBC WITH DIFFERENTIAL/PLATELET
Abs Immature Granulocytes: 0.01 K/uL (ref 0.00–0.07)
Basophils Absolute: 0 K/uL (ref 0.0–0.1)
Basophils Relative: 1 %
Eosinophils Absolute: 0.2 K/uL (ref 0.0–0.5)
Eosinophils Relative: 4 %
HCT: 29.9 % — ABNORMAL LOW (ref 39.0–52.0)
Hemoglobin: 9.5 g/dL — ABNORMAL LOW (ref 13.0–17.0)
Immature Granulocytes: 0 %
Lymphocytes Relative: 41 %
Lymphs Abs: 1.6 K/uL (ref 0.7–4.0)
MCH: 30.7 pg (ref 26.0–34.0)
MCHC: 31.8 g/dL (ref 30.0–36.0)
MCV: 96.8 fL (ref 80.0–100.0)
Monocytes Absolute: 0.3 K/uL (ref 0.1–1.0)
Monocytes Relative: 9 %
Neutro Abs: 1.8 K/uL (ref 1.7–7.7)
Neutrophils Relative %: 45 %
Platelets: 163 K/uL (ref 150–400)
RBC: 3.09 MIL/uL — ABNORMAL LOW (ref 4.22–5.81)
RDW: 13.7 % (ref 11.5–15.5)
WBC: 4 K/uL (ref 4.0–10.5)
nRBC: 0 % (ref 0.0–0.2)

## 2024-02-18 LAB — COMPREHENSIVE METABOLIC PANEL WITH GFR
ALT: 16 U/L (ref 0–44)
AST: 24 U/L (ref 15–41)
Albumin: 3.5 g/dL (ref 3.5–5.0)
Alkaline Phosphatase: 55 U/L (ref 38–126)
Anion gap: 11 (ref 5–15)
BUN: 53 mg/dL — ABNORMAL HIGH (ref 8–23)
CO2: 18 mmol/L — ABNORMAL LOW (ref 22–32)
Calcium: 8.8 mg/dL — ABNORMAL LOW (ref 8.9–10.3)
Chloride: 115 mmol/L — ABNORMAL HIGH (ref 98–111)
Creatinine, Ser: 5.32 mg/dL — ABNORMAL HIGH (ref 0.61–1.24)
GFR, Estimated: 11 mL/min — ABNORMAL LOW (ref >60)
Glucose, Bld: 101 mg/dL — ABNORMAL HIGH (ref 70–99)
Potassium: 4.5 mmol/L (ref 3.5–5.1)
Sodium: 144 mmol/L (ref 135–145)
Total Bilirubin: 0.6 mg/dL (ref 0.0–1.2)
Total Protein: 6.5 g/dL (ref 6.5–8.1)

## 2024-02-18 LAB — GLUCOSE, CAPILLARY: Glucose-Capillary: 90 mg/dL (ref 70–99)

## 2024-02-18 LAB — TROPONIN T, HIGH SENSITIVITY
Troponin T High Sensitivity: 153 ng/L (ref 0–19)
Troponin T High Sensitivity: 152 ng/L (ref 0–19)

## 2024-02-18 LAB — PRO BRAIN NATRIURETIC PEPTIDE: Pro Brain Natriuretic Peptide: 13961 pg/mL — ABNORMAL HIGH (ref <300.0)

## 2024-02-18 MED ORDER — HYDRALAZINE HCL 20 MG/ML IJ SOLN
10.0000 mg | Freq: Once | INTRAMUSCULAR | Status: AC
  Administered 2024-02-18: 10 mg via INTRAVENOUS
  Filled 2024-02-18: qty 1

## 2024-02-18 MED ORDER — ACETAMINOPHEN 650 MG RE SUPP: 650.0000 mg | Freq: Four times a day (QID) | RECTAL | Status: DC | PRN

## 2024-02-18 MED ORDER — FENTANYL CITRATE PF 50 MCG/ML IJ SOSY: 12.5000 ug | PREFILLED_SYRINGE | INTRAMUSCULAR | Status: DC | PRN

## 2024-02-18 MED ORDER — MELATONIN 3 MG PO TABS
3.0000 mg | ORAL_TABLET | Freq: Every evening | ORAL | Status: DC | PRN
Start: 2024-02-18 — End: 2024-02-22

## 2024-02-18 MED ORDER — FENTANYL CITRATE PF 50 MCG/ML IJ SOSY
50.0000 ug | PREFILLED_SYRINGE | Freq: Once | INTRAMUSCULAR | Status: AC | PRN
  Administered 2024-02-18: 50 ug via INTRAVENOUS
  Filled 2024-02-18: qty 1

## 2024-02-18 MED ORDER — MORPHINE SULFATE (PF) 4 MG/ML IV SOLN
4.0000 mg | Freq: Once | INTRAVENOUS | Status: AC
  Administered 2024-02-18: 4 mg via INTRAVENOUS
  Filled 2024-02-18: qty 1

## 2024-02-18 MED ORDER — INSULIN ASPART 100 UNIT/ML IJ SOLN: 0.0000 [IU] | Freq: Three times a day (TID) | INTRAMUSCULAR | Status: DC

## 2024-02-18 MED ORDER — ACETAMINOPHEN 325 MG PO TABS: 650.0000 mg | ORAL_TABLET | Freq: Four times a day (QID) | ORAL | Status: DC | PRN

## 2024-02-18 MED ORDER — ASPIRIN 325 MG PO TABS
325.0000 mg | ORAL_TABLET | Freq: Every day | ORAL | Status: DC
  Administered 2024-02-19 – 2024-02-22 (×4): 325 mg via ORAL
  Filled 2024-02-18 (×4): qty 1

## 2024-02-18 MED ORDER — NITROGLYCERIN 0.4 MG SL SUBL
0.4000 mg | SUBLINGUAL_TABLET | SUBLINGUAL | Status: DC | PRN
  Administered 2024-02-18 (×3): 0.4 mg via SUBLINGUAL
  Filled 2024-02-18: qty 1

## 2024-02-18 MED ORDER — AMLODIPINE BESYLATE 10 MG PO TABS
10.0000 mg | ORAL_TABLET | Freq: Every day | ORAL | Status: DC
  Administered 2024-02-19 – 2024-02-22 (×4): 10 mg via ORAL
  Filled 2024-02-18 (×4): qty 1

## 2024-02-18 MED ORDER — FUROSEMIDE 10 MG/ML IJ SOLN: 40.0000 mg | Freq: Two times a day (BID) | INTRAMUSCULAR | Status: DC

## 2024-02-18 MED ORDER — ONDANSETRON HCL 4 MG/2ML IJ SOLN: 4.0000 mg | Freq: Four times a day (QID) | INTRAMUSCULAR | Status: DC | PRN

## 2024-02-18 MED ORDER — ACETAMINOPHEN 500 MG PO TABS
1000.0000 mg | ORAL_TABLET | Freq: Four times a day (QID) | ORAL | Status: DC | PRN
  Administered 2024-02-20 – 2024-02-21 (×3): 1000 mg via ORAL
  Filled 2024-02-18 (×3): qty 2

## 2024-02-18 MED ORDER — NALOXONE HCL 0.4 MG/ML IJ SOLN: 0.4000 mg | INTRAMUSCULAR | Status: DC | PRN

## 2024-02-18 MED ORDER — NITROGLYCERIN 2 % TD OINT
0.5000 [in_us] | TOPICAL_OINTMENT | Freq: Once | TRANSDERMAL | Status: AC
  Administered 2024-02-18: 0.5 [in_us] via TOPICAL
  Filled 2024-02-18: qty 1

## 2024-02-18 MED ORDER — PNEUMOCOCCAL 20-VAL CONJ VACC 0.5 ML IM SUSY
0.5000 mL | PREFILLED_SYRINGE | INTRAMUSCULAR | Status: AC
  Administered 2024-02-19: 0.5 mL via INTRAMUSCULAR
  Filled 2024-02-18 (×2): qty 0.5

## 2024-02-18 MED ORDER — HYDRALAZINE HCL 50 MG PO TABS
100.0000 mg | ORAL_TABLET | Freq: Three times a day (TID) | ORAL | Status: DC
  Administered 2024-02-19 – 2024-02-22 (×9): 100 mg via ORAL
  Filled 2024-02-18 (×9): qty 2

## 2024-02-18 MED ORDER — FUROSEMIDE 10 MG/ML IJ SOLN
60.0000 mg | Freq: Once | INTRAMUSCULAR | Status: AC
  Administered 2024-02-18: 60 mg via INTRAVENOUS
  Filled 2024-02-18: qty 6

## 2024-02-18 MED ORDER — FENTANYL CITRATE PF 50 MCG/ML IJ SOSY
12.5000 ug | PREFILLED_SYRINGE | INTRAMUSCULAR | Status: DC | PRN
  Administered 2024-02-18 – 2024-02-21 (×3): 12.5 ug via INTRAVENOUS
  Administered 2024-02-21 – 2024-02-22 (×2): 50 ug via INTRAVENOUS
  Filled 2024-02-18 (×5): qty 1

## 2024-02-18 MED ORDER — HYDRALAZINE HCL 20 MG/ML IJ SOLN
10.0000 mg | INTRAMUSCULAR | Status: DC | PRN
  Administered 2024-02-18 – 2024-02-21 (×5): 10 mg via INTRAVENOUS
  Filled 2024-02-18 (×5): qty 1

## 2024-02-18 NOTE — ED Notes (Signed)
 Ice pack for left shoulder Pillow wedge support for left shoulder
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
## 2024-02-18 NOTE — Progress Notes (Signed)
 Plan of Care Note for accepted transfer  Patient: Samuel [name]    FMW:[id number]  DOA: 02/18/2024     Nursing staff, Please call [facility] Admits & Consults System-Wide number on Amion as soon as patient's arrival to the unit (not the listed attending) so that the appropriate admitting provider can evaluate the pt. ASAP to avoid any delay in care.  Facility requesting transfer: [facility] Requesting Provider: Dr. Emil Reason for transfer: Admission for heart failure Facility course: Patient with past medical history of HTN, CKD stage V, type II DM, ICH, CVA with left hemiparesis, AV graft placement. Presented to the ER with complaints of left shoulder pain. Upon further workup the shoulder x-ray shows evidence of chronic arthritis without any acute fracture. Per ED provider, while in the ED the patient also appeared to have volume overloaded.  Also shortness of breath reported by the patient per EDP.  Chest x-ray shows infiltrate versus vascular congestion. Troponins were performed which were elevated as well as NT proBNP but the patient has CKD. Patient has been on room air only but has significant high blood pressure with systolic blood pressure in 200. Admission was requested for acute on chronic CHF likely diastolic. Patient received IV Lasix . Accepted the patient for progressive care unit. Patient primarily gets his care at [facility]. Patient is aware that he may need to initiate hemodialysis if his volume status does not improve and patient understands that that might have to happen here if he did not respond to diuresis.  Plan of care: The patient is accepted for admission to Progressive unit, at [facility]. Not a candidate for transfer to other campuses.  Author: Yetta Blanch, MD  02/18/2024  Check [email or web address] for on-call coverage.
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
## 2024-02-18 NOTE — ED Triage Notes (Signed)
 Pt reports left shoulder pain. States he was talking on the phone when he felt the shoulder popped. Obvious deformity noted.Denies other symptoms

## 2024-02-18 NOTE — ED Notes (Signed)
 Pt departed ED at this time with [facility]

## 2024-02-19 ENCOUNTER — Inpatient Hospital Stay (HOSPITAL_COMMUNITY)

## 2024-02-19 DIAGNOSIS — M19012 Primary osteoarthritis, left shoulder: Secondary | ICD-10-CM | POA: Diagnosis present

## 2024-02-19 DIAGNOSIS — N179 Acute kidney failure, unspecified: Secondary | ICD-10-CM | POA: Diagnosis not present

## 2024-02-19 DIAGNOSIS — I132 Hypertensive heart and chronic kidney disease with heart failure and with stage 5 chronic kidney disease, or end stage renal disease: Secondary | ICD-10-CM | POA: Diagnosis present

## 2024-02-19 DIAGNOSIS — Z7982 Long term (current) use of aspirin: Secondary | ICD-10-CM | POA: Diagnosis not present

## 2024-02-19 DIAGNOSIS — I444 Left anterior fascicular block: Secondary | ICD-10-CM | POA: Diagnosis present

## 2024-02-19 DIAGNOSIS — I5033 Acute on chronic diastolic (congestive) heart failure: Secondary | ICD-10-CM

## 2024-02-19 DIAGNOSIS — Z79899 Other long term (current) drug therapy: Secondary | ICD-10-CM | POA: Diagnosis not present

## 2024-02-19 DIAGNOSIS — G8929 Other chronic pain: Secondary | ICD-10-CM | POA: Diagnosis present

## 2024-02-19 DIAGNOSIS — F17210 Nicotine dependence, cigarettes, uncomplicated: Secondary | ICD-10-CM | POA: Diagnosis present

## 2024-02-19 DIAGNOSIS — E1122 Type 2 diabetes mellitus with diabetic chronic kidney disease: Secondary | ICD-10-CM | POA: Diagnosis present

## 2024-02-19 DIAGNOSIS — N185 Chronic kidney disease, stage 5: Secondary | ICD-10-CM | POA: Diagnosis present

## 2024-02-19 DIAGNOSIS — I69354 Hemiplegia and hemiparesis following cerebral infarction affecting left non-dominant side: Secondary | ICD-10-CM | POA: Diagnosis not present

## 2024-02-19 DIAGNOSIS — Z23 Encounter for immunization: Secondary | ICD-10-CM | POA: Diagnosis present

## 2024-02-19 DIAGNOSIS — D631 Anemia in chronic kidney disease: Secondary | ICD-10-CM | POA: Diagnosis present

## 2024-02-19 DIAGNOSIS — I509 Heart failure, unspecified: Secondary | ICD-10-CM | POA: Diagnosis present

## 2024-02-19 LAB — COMPREHENSIVE METABOLIC PANEL WITH GFR
ALT: 15 U/L (ref 0–44)
AST: 21 U/L (ref 15–41)
Albumin: 3 g/dL — ABNORMAL LOW (ref 3.5–5.0)
Alkaline Phosphatase: 49 U/L (ref 38–126)
Anion gap: 7 (ref 5–15)
BUN: 57 mg/dL — ABNORMAL HIGH (ref 8–23)
CO2: 16 mmol/L — ABNORMAL LOW (ref 22–32)
Calcium: 8.7 mg/dL — ABNORMAL LOW (ref 8.9–10.3)
Chloride: 120 mmol/L — ABNORMAL HIGH (ref 98–111)
Creatinine, Ser: 5.63 mg/dL — ABNORMAL HIGH (ref 0.61–1.24)
GFR, Estimated: 10 mL/min — ABNORMAL LOW (ref >60)
Glucose, Bld: 89 mg/dL (ref 70–99)
Potassium: 4.4 mmol/L (ref 3.5–5.1)
Sodium: 143 mmol/L (ref 135–145)
Total Bilirubin: 1.1 mg/dL (ref 0.0–1.2)
Total Protein: 6.1 g/dL — ABNORMAL LOW (ref 6.5–8.1)

## 2024-02-19 LAB — CBC WITH DIFFERENTIAL/PLATELET
Abs Immature Granulocytes: 0.03 K/uL (ref 0.00–0.07)
Basophils Absolute: 0.1 K/uL (ref 0.0–0.1)
Basophils Relative: 1 %
Eosinophils Absolute: 0.2 K/uL (ref 0.0–0.5)
Eosinophils Relative: 4 %
HCT: 30.9 % — ABNORMAL LOW (ref 39.0–52.0)
Hemoglobin: 9.9 g/dL — ABNORMAL LOW (ref 13.0–17.0)
Immature Granulocytes: 1 %
Lymphocytes Relative: 46 %
Lymphs Abs: 3 K/uL (ref 0.7–4.0)
MCH: 30.7 pg (ref 26.0–34.0)
MCHC: 32 g/dL (ref 30.0–36.0)
MCV: 96 fL (ref 80.0–100.0)
Monocytes Absolute: 0.6 K/uL (ref 0.1–1.0)
Monocytes Relative: 9 %
Neutro Abs: 2.5 K/uL (ref 1.7–7.7)
Neutrophils Relative %: 39 %
Platelets: 169 K/uL (ref 150–400)
RBC: 3.22 MIL/uL — ABNORMAL LOW (ref 4.22–5.81)
RDW: 13.5 % (ref 11.5–15.5)
WBC: 6.4 K/uL (ref 4.0–10.5)
nRBC: 0 % (ref 0.0–0.2)

## 2024-02-19 LAB — ECHOCARDIOGRAM COMPLETE
Area-P 1/2: 3.39 cm2
Height: 77 in
S' Lateral: 2.9 cm
Weight: 2792 [oz_av]

## 2024-02-19 LAB — BRAIN NATRIURETIC PEPTIDE: B Natriuretic Peptide: 2804.5 pg/mL — ABNORMAL HIGH (ref 0.0–100.0)

## 2024-02-19 LAB — GLUCOSE, CAPILLARY
Glucose-Capillary: 85 mg/dL (ref 70–99)
Glucose-Capillary: 105 mg/dL — ABNORMAL HIGH (ref 70–99)
Glucose-Capillary: 86 mg/dL (ref 70–99)
Glucose-Capillary: 96 mg/dL (ref 70–99)

## 2024-02-19 LAB — PHOSPHORUS: Phosphorus: 4 mg/dL (ref 2.5–4.6)

## 2024-02-19 LAB — MAGNESIUM: Magnesium: 2 mg/dL (ref 1.7–2.4)

## 2024-02-19 LAB — HEMOGLOBIN A1C
Hgb A1c MFr Bld: 4.2 % — ABNORMAL LOW (ref 4.8–5.6)
Mean Plasma Glucose: 73.84 mg/dL

## 2024-02-19 LAB — PROTIME-INR
INR: 1.2 (ref 0.8–1.2)
Prothrombin Time: 15.4 s — ABNORMAL HIGH (ref 11.4–15.2)

## 2024-02-19 LAB — PROCALCITONIN: Procalcitonin: <0.1 ng/mL

## 2024-02-19 MED ORDER — FUROSEMIDE 10 MG/ML IJ SOLN
80.0000 mg | Freq: Two times a day (BID) | INTRAMUSCULAR | Status: AC
  Administered 2024-02-19 (×2): 80 mg via INTRAVENOUS
  Filled 2024-02-19 (×2): qty 8

## 2024-02-19 MED ORDER — METHOCARBAMOL 500 MG PO TABS
500.0000 mg | ORAL_TABLET | Freq: Two times a day (BID) | ORAL | Status: DC
  Administered 2024-02-19 – 2024-02-21 (×5): 500 mg via ORAL
  Filled 2024-02-19 (×5): qty 1
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
## 2024-02-19 NOTE — Evaluation (Signed)
 Physical Therapy Evaluation Patient Details Name: Samuel [name] MRN: [id number] DOB: 10/27/1950 Today's Date: 02/19/2024  History of Present Illness  73/M presented with L shoulder pain and dyspnea on exertion.Pt found to have interstitial edema PMH: CKD 5 with AV graft in place, not on HD yet, chronic diastolic CHF, HTN, ischemic CVA with residual left-sided paresis, chronic diastolic heart failure, anemia of chronic kidney disease, h/o degenerative arthritis to L shoulder   Clinical Impression  Pt admitted with above. PTA pt lived alone in an apartment and was mod I with use of SPC. Pt now presenting with impaired balance, generalized deconditioning, decreased activity tolerance, requires assist for ADLs and transfers, and requires use of RW for safe standing and ambulation at this time. Pt with c/o lightheadedness/dizziness with upright position however vitals noted to be stable. Pt with painful L shld limiting pt use functionally. Recommending OT consult to address ADLs and impaired bilat shld ROM and function. Pt adamantly refusing inpatient rehab and reports he will hire a CNA to come and help him at his house. Recommend HHPT upon d/c to address above deficits and progress back to mod I level of function. Acute PT to cont to follow.        If plan is discharge home, recommend the following: A little help with walking and/or transfers;A little help with bathing/dressing/bathroom;Assist for transportation;Help with stairs or ramp for entrance   Can travel by private vehicle        Equipment Recommendations Rolling walker (2 wheels);BSC/3in1  Recommendations for Other Services       Functional Status Assessment Patient has had a recent decline in their functional status and demonstrates the ability to make significant improvements in function in a reasonable and predictable amount of time.     Precautions / Restrictions Precautions Precautions: Fall Recall of  Precautions/Restrictions: Intact Restrictions Weight Bearing Restrictions Per Provider Order: No      Mobility  Bed Mobility Overal bed mobility: Needs Assistance Bed Mobility: Supine to Sit     Supine to sit: Supervision     General bed mobility comments: HOB elevated, use of bed rail, no physical assist    Transfers Overall transfer level: Needs assistance Equipment used: Rolling walker (2 wheels) Transfers: Sit to/from Stand Sit to Stand: Min assist           General transfer comment: minA to power up, patient requested bed to be elevated to improve ease of powering up due to pt 6'5. initally labored effort and pt fatigued with onset of dizzienss requiring immeadiate return to sitting EOB, VSS    Ambulation/Gait Ambulation/Gait assistance: Min assist Gait Distance (Feet): 15 Feet (from one side of bed to the other) Assistive device: Rolling walker (2 wheels) Gait Pattern/deviations: Step-through pattern, Decreased stride length, Trunk flexed Gait velocity: dec Gait velocity interpretation: <1.31 ft/sec, indicative of household ambulator   General Gait Details: decreased step height, trunk flexion, minA for walker management around obstacles  Stairs            Wheelchair Mobility     Tilt Bed    Modified Rankin (Stroke Patients Only)       Balance Overall balance assessment: Needs assistance Sitting-balance support: Feet supported, Bilateral upper extremity supported Sitting balance-Leahy Scale: Fair Sitting balance - Comments: pt leaning on elbows   Standing balance support: Bilateral upper extremity supported, During functional activity, Reliant on assistive device for balance Standing balance-Leahy Scale: Poor Standing balance comment: reliant on external support  Pertinent Vitals/Pain Pain Assessment Pain Assessment: Faces Faces Pain Scale: Hurts little more Pain Location: L shoulder to palpation to  both anterior and posterior glenohumeral joint, noted edema, pain with ROM as well but not at rest Pain Descriptors / Indicators: Sharp Pain Intervention(s): Monitored during session    Home Living Family/patient expects to be discharged to:: Private residence Living Arrangements: Alone Available Help at Discharge: Family;Available PRN/intermittently Type of Home: Apartment Home Access: Stairs to enter Entrance Stairs-Rails: None Entrance Stairs-Number of Steps: 4   Home Layout: One level Home Equipment: Cane - single point      Prior Function Prior Level of Function : Independent/Modified Independent;Driving             Mobility Comments: uses cane ADLs Comments: indep     Extremity/Trunk Assessment   Upper Extremity Assessment Upper Extremity Assessment: RUE deficits/detail;LUE deficits/detail RUE Deficits / Details: limited shld AROM in all directions LUE Deficits / Details: limited AROM to shld in all directions, able to bend elbow    Lower Extremity Assessment Lower Extremity Assessment: Generalized weakness (noted bilat LE edema)    Cervical / Trunk Assessment Cervical / Trunk Assessment: Normal  Communication   Communication Communication: No apparent difficulties    Cognition Arousal: Alert Behavior During Therapy: WFL for tasks assessed/performed   PT - Cognitive impairments: No apparent impairments                       PT - Cognition Comments: pt eager to mobilize, aware of dizziness and deficits Following commands: Intact       Cueing Cueing Techniques: Verbal cues     General Comments General comments (skin integrity, edema, etc.): bilat LE edema, edema in bilat shoulers, VSS, BP stable despite lightheadedness, SpO2 > 97% on RA    Exercises     Assessment/Plan    PT Assessment Patient needs continued PT services  PT Problem List Decreased strength;Decreased activity tolerance;Decreased balance;Decreased mobility       PT  Treatment Interventions DME instruction;Gait training;Stair training;Functional mobility training;Therapeutic activities;Therapeutic exercise;Balance training;Neuromuscular re-education    PT Goals (Current goals can be found in the Care Plan section)  Acute Rehab PT Goals Patient Stated Goal: home PT Goal Formulation: With patient Time For Goal Achievement: 03/04/24 Potential to Achieve Goals: Good    Frequency Min 3X/week     Co-evaluation               AM-PAC PT 6 Clicks Mobility  Outcome Measure Help needed turning from your back to your side while in a flat bed without using bedrails?: None Help needed moving from lying on your back to sitting on the side of a flat bed without using bedrails?: None Help needed moving to and from a bed to a chair (including a wheelchair)?: A Little Help needed standing up from a chair using your arms (e.g., wheelchair or bedside chair)?: A Little Help needed to walk in hospital room?: A Lot Help needed climbing 3-5 steps with a railing? : A Lot 6 Click Score: 18    End of Session Equipment Utilized During Treatment: Gait belt Activity Tolerance: Patient limited by fatigue Patient left: in chair;with call bell/phone within reach;with chair alarm set Nurse Communication: Mobility status (need for green box, disposal of of urine from urinal, request for bath) PT Visit Diagnosis: Unsteadiness on feet (R26.81)    Time: 8955-8887 PT Time Calculation (min) (ACUTE ONLY): 28 min   Charges:   PT  Evaluation $PT Eval Moderate Complexity: 1 Mod PT Treatments $Gait Training: 8-22 mins PT General Charges $$ ACUTE PT VISIT: 1 Visit         Norene Ames, PT, DPT Acute Rehabilitation Services Secure chat preferred Office #: [phone]   Norene CHRISTELLA Ames 02/19/2024, 1:53 PM

## 2024-02-19 NOTE — Plan of Care (Signed)
  Problem: Clinical Measurements: Goal: Respiratory complications will improve Outcome: Progressing Goal: Cardiovascular complication will be avoided Outcome: Progressing   Problem: Pain Managment: Goal: General experience of comfort will improve and/or be controlled Outcome: Progressing   Problem: Safety: Goal: Ability to remain free from injury will improve Outcome: Progressing

## 2024-02-19 NOTE — Care Management Obs Status (Signed)
 MEDICARE OBSERVATION STATUS NOTIFICATION   Patient Details  Name: Samuel [name] MRN: [id number] Date of Birth: Oct 24, 1950   Medicare Observation Status Notification Given:  Yes    [name] 02/19/2024, 8:43 AM

## 2024-02-20 DIAGNOSIS — I5033 Acute on chronic diastolic (congestive) heart failure: Secondary | ICD-10-CM | POA: Diagnosis not present

## 2024-02-20 LAB — GLUCOSE, CAPILLARY
Glucose-Capillary: 125 mg/dL — ABNORMAL HIGH (ref 70–99)
Glucose-Capillary: 90 mg/dL (ref 70–99)
Glucose-Capillary: 89 mg/dL (ref 70–99)
Glucose-Capillary: 80 mg/dL (ref 70–99)

## 2024-02-20 LAB — BASIC METABOLIC PANEL WITH GFR
Anion gap: 12 (ref 5–15)
BUN: 57 mg/dL — ABNORMAL HIGH (ref 8–23)
CO2: 16 mmol/L — ABNORMAL LOW (ref 22–32)
Calcium: 8.8 mg/dL — ABNORMAL LOW (ref 8.9–10.3)
Chloride: 113 mmol/L — ABNORMAL HIGH (ref 98–111)
Creatinine, Ser: 5.82 mg/dL — ABNORMAL HIGH (ref 0.61–1.24)
GFR, Estimated: 10 mL/min — ABNORMAL LOW (ref >60)
Glucose, Bld: 87 mg/dL (ref 70–99)
Potassium: 4.2 mmol/L (ref 3.5–5.1)
Sodium: 141 mmol/L (ref 135–145)

## 2024-02-20 MED ORDER — SODIUM BICARBONATE 650 MG PO TABS
650.0000 mg | ORAL_TABLET | Freq: Two times a day (BID) | ORAL | Status: DC
  Administered 2024-02-20 – 2024-02-22 (×5): 650 mg via ORAL
  Filled 2024-02-20 (×5): qty 1

## 2024-02-20 NOTE — Progress Notes (Signed)
 Heart Failure Navigator Progress Note  Assessed for Heart & Vascular TOC clinic readiness.  Patient does not meet criteria due to EF 55-60%, CKD V, last Creatine 5.82. Per Dr. Fairy No HF [facility] plans for patient to start hemodialysis in a couple months. .   Navigator will sign off at this time.   Stephane Haddock, BSN, Scientist, clinical (histocompatibility and immunogenetics) Only

## 2024-02-20 NOTE — Plan of Care (Signed)
  Problem: Clinical Measurements: Goal: Respiratory complications will improve Outcome: Progressing Goal: Cardiovascular complication will be avoided Outcome: Progressing   Problem: Activity: Goal: Risk for activity intolerance will decrease Outcome: Progressing   Problem: Nutrition: Goal: Adequate nutrition will be maintained Outcome: Progressing   Problem: Coping: Goal: Level of anxiety will decrease Outcome: Progressing   Problem: Elimination: Goal: Will not experience complications related to urinary retention Outcome: Progressing   Problem: Pain Managment: Goal: General experience of comfort will improve and/or be controlled Outcome: Progressing   Problem: Safety: Goal: Ability to remain free from injury will improve Outcome: Progressing

## 2024-02-20 NOTE — Progress Notes (Signed)
 Physical Therapy Treatment Patient Details Name: Samuel [name] MRN: [id number] DOB: 05/28/1951 Today's Date: 02/20/2024   History of Present Illness 73/M presented with L shoulder pain and dyspnea on exertion.Pt found to have interstitial edema PMH: CKD 5 with AV graft in place, not on HD yet, chronic diastolic CHF, HTN, ischemic CVA with residual left-sided paresis, chronic diastolic heart failure, anemia of chronic kidney disease, h/o degenerative arthritis to L shoulder    PT Comments  Pt is progressing well.  Still adamant about going home, getting his CNA neighbor to assist him and do all this with a cane.  Asked him to consider using a RW out and about without commital.     If plan is discharge home, recommend the following: Other (comment) (PRN assist)   Can travel by private vehicle        Equipment Recommendations  Rolling walker (2 wheels);BSC/3in1    Recommendations for Other Services       Precautions / Restrictions Precautions Precautions: Fall Recall of Precautions/Restrictions: Intact     Mobility  Bed Mobility Overal bed mobility: Modified Independent                  Transfers Overall transfer level: Modified independent                      Ambulation/Gait Ambulation/Gait assistance: Contact guard assist, Min assist Gait Distance (Feet): 350 Feet Assistive device: Straight cane Gait Pattern/deviations: Step-through pattern, Decreased stride length, Scissoring   Gait velocity interpretation: <1.8 ft/sec, indicate of risk for recurrent falls   General Gait Details: overall mildly unsteady with safe use of the cane, but alot of scissoring to maintain balance.  A RW/rollator might be more appropriate and pt was told, but he prefers to use the cane.  He DID improve incrementally as he progressed in distance until the last 50 feet with fatigue.   Stairs Stairs: Yes Stairs assistance: Supervision Stair Management: One rail Left, With  cane Number of Stairs: 4 General stair comments: safe with rail and AD   Wheelchair Mobility     Tilt Bed    Modified Rankin (Stroke Patients Only)       Balance Overall balance assessment: Needs assistance   Sitting balance-Leahy Scale: Good       Standing balance-Leahy Scale: Poor Standing balance comment: reliant on external support                            Communication Communication Communication: No apparent difficulties  Cognition Arousal: Alert Behavior During Therapy: WFL for tasks assessed/performed   PT - Cognitive impairments: No apparent impairments                         Following commands: Intact      Cueing Cueing Techniques: Verbal cues  Exercises      General Comments General comments (skin integrity, edema, etc.): VSS with HR in the low 90's and SpO2 in mid 90's on RA      Pertinent Vitals/Pain Pain Assessment Pain Assessment: Faces Faces Pain Scale: Hurts little more Pain Location: L shoulder with ROM Pain Descriptors / Indicators: Aching, Discomfort, Grimacing, Guarding Pain Intervention(s): Monitored during session    Home Living                          Prior Function  PT Goals (current goals can now be found in the care plan section) Acute Rehab PT Goals Patient Stated Goal: home PT Goal Formulation: With patient Time For Goal Achievement: 03/04/24 Potential to Achieve Goals: Good Progress towards PT goals: Progressing toward goals    Frequency    Min 3X/week      PT Plan      Co-evaluation              AM-PAC PT 6 Clicks Mobility   Outcome Measure  Help needed turning from your back to your side while in a flat bed without using bedrails?: None Help needed moving from lying on your back to sitting on the side of a flat bed without using bedrails?: None Help needed moving to and from a bed to a chair (including a wheelchair)?: A Little Help needed standing  up from a chair using your arms (e.g., wheelchair or bedside chair)?: None Help needed to walk in hospital room?: A Little Help needed climbing 3-5 steps with a railing? : A Little 6 Click Score: 21    End of Session   Activity Tolerance: Patient limited by fatigue Patient left: in bed;in CPM   PT Visit Diagnosis: Unsteadiness on feet (R26.81)     Time: 8153-8095 PT Time Calculation (min) (ACUTE ONLY): 18 min  Charges:    $Gait Training: 8-22 mins PT General Charges $$ ACUTE PT VISIT: 1 Visit                     02/20/2024  [place] HERO., PT Acute Rehabilitation Services [phone]  (office)02/20/2024  [place] HERO., PT Acute Rehabilitation Services [phone]  (office)   Vinie GAILS Muhammadali Ries 02/20/2024, 7:21 PM

## 2024-02-20 NOTE — Plan of Care (Signed)
  Problem: Education: Goal: Knowledge of General Education information will improve Description: Including pain rating scale, medication(s)/side effects and non-pharmacologic comfort measures Outcome: Progressing   Problem: Health Behavior/Discharge Planning: Goal: Ability to manage health-related needs will improve Outcome: Progressing   Problem: Clinical Measurements: Goal: Ability to maintain clinical measurements within normal limits will improve Outcome: Progressing Goal: Will remain free from infection Outcome: Progressing Goal: Diagnostic test results will improve Outcome: Progressing Goal: Respiratory complications will improve Outcome: Progressing Goal: Cardiovascular complication will be avoided Outcome: Progressing   Problem: Activity: Goal: Risk for activity intolerance will decrease Outcome: Progressing   Problem: Nutrition: Goal: Adequate nutrition will be maintained Outcome: Progressing   Problem: Coping: Goal: Level of anxiety will decrease Outcome: Progressing   Problem: Elimination: Goal: Will not experience complications related to bowel motility Outcome: Progressing Goal: Will not experience complications related to urinary retention Outcome: Progressing   Problem: Pain Managment: Goal: General experience of comfort will improve and/or be controlled Outcome: Progressing   Problem: Safety: Goal: Ability to remain free from injury will improve Outcome: Progressing   Problem: Skin Integrity: Goal: Risk for impaired skin integrity will decrease Outcome: Progressing   Problem: Education: Goal: Understanding of cardiac disease, CV risk reduction, and recovery process will improve Outcome: Progressing Goal: Individualized Educational Video(s) Outcome: Progressing   Problem: Activity: Goal: Ability to tolerate increased activity will improve Outcome: Progressing   Problem: Cardiac: Goal: Ability to achieve and maintain adequate cardiovascular  perfusion will improve Outcome: Progressing   Problem: Health Behavior/Discharge Planning: Goal: Ability to safely manage health-related needs after discharge will improve Outcome: Progressing   Problem: Education: Goal: Ability to describe self-care measures that may prevent or decrease complications (Diabetes Survival Skills Education) will improve Outcome: Progressing Goal: Individualized Educational Video(s) Outcome: Progressing   Problem: Coping: Goal: Ability to adjust to condition or change in health will improve Outcome: Progressing   Problem: Fluid Volume: Goal: Ability to maintain a balanced intake and output will improve Outcome: Progressing   Problem: Health Behavior/Discharge Planning: Goal: Ability to identify and utilize available resources and services will improve Outcome: Progressing Goal: Ability to manage health-related needs will improve Outcome: Progressing   Problem: Metabolic: Goal: Ability to maintain appropriate glucose levels will improve Outcome: Progressing   Problem: Nutritional: Goal: Maintenance of adequate nutrition will improve Outcome: Progressing Goal: Progress toward achieving an optimal weight will improve Outcome: Progressing   Problem: Skin Integrity: Goal: Risk for impaired skin integrity will decrease Outcome: Progressing   Problem: Tissue Perfusion: Goal: Adequacy of tissue perfusion will improve Outcome: Progressing

## 2024-02-20 NOTE — Evaluation (Signed)
 Occupational Therapy Evaluation Patient Details Name: Samuel [name] MRN: [id number] DOB: 1950/11/05 Today's Date: 02/20/2024   History of Present Illness   73/M presented with L shoulder pain and dyspnea on exertion.Pt found to have interstitial edema PMH: CKD 5 with AV graft in place, not on HD yet, chronic diastolic CHF, HTN, ischemic CVA with residual left-sided paresis, chronic diastolic heart failure, anemia of chronic kidney disease, h/o degenerative arthritis to L shoulder     Clinical Impressions Pt presents with decline in function and safety with ADLs and ADL mobility with impaired strength, balance, endurance and B UE AROM. PTA pt reports that he lives alone in an apartment (says he has a wife but do not live together, has grand kids nearby) and was Ind with ADLs, home mgt, used cane for mobility and drives. Pt states that he dresses himself at home without difficulty and that shoulders weren't bothering him unitl Monday of this week and that the Dr said I have arthritis in my shoulders but I don't think so. Pt currently requires Sup to sit EOB, mod A with LB ADLs, set up/Sup with UB ADLs and demos significantly impaired B shoulder AROM. OT initiated ROM exerices of B shoulders, instructing pt on assisting L UE with R UE, however pt states that he knows what exercises to do and that these aren't gonna work. B shoulders currently impaired and less than AROM considered WFL although pt states that he was Ind and had no diffulty at home/PLOF. Pt fatigues easily during ADL activity with labored breathing although VSS. Educated pt extensively on ADL and ADL mobility safety and how post acute rehab setting would benefit him as well as reduce chance of re hospitalization, however pt adamantly insisted that he does not need therapy after hospital d/c and wants to go home and needs to feed his dog. OT will follow acutely to maximize level of function and safety   If plan is discharge home,  recommend the following:   A lot of help with bathing/dressing/bathroom;A little help with walking and/or transfers;Assistance with cooking/housework;Assist for transportation;Help with stairs or ramp for entrance     Functional Status Assessment   Patient has had a recent decline in their functional status and demonstrates the ability to make significant improvements in function in a reasonable and predictable amount of time.     Equipment Recommendations   Tub/shower bench;Toilet riser     Recommendations for Other Services         Precautions/Restrictions   Precautions Precautions: Fall Recall of Precautions/Restrictions: Intact Restrictions Weight Bearing Restrictions Per Provider Order: No     Mobility Bed Mobility Overal bed mobility: Needs Assistance Bed Mobility: Supine to Sit, Sit to Supine     Supine to sit: Supervision Sit to supine: Supervision   General bed mobility comments: HOB elevated, use of bed rail, no physical assist. Pt declined sitting up in chair at end of session    Transfers Overall transfer level: Needs assistance Equipment used: Rolling walker (2 wheels) Transfers: Sit to/from Stand Sit to Stand: Min assist           General transfer comment: min A to power up, labored effort, pt returned to sitting EOB and pt fatigued quickly during  LBADL tasks      Balance Overall balance assessment: Needs assistance Sitting-balance support: Feet supported, Bilateral upper extremity supported Sitting balance-Leahy Scale: Fair     Standing balance support: Bilateral upper extremity supported, During functional activity, Reliant on assistive device  for balance Standing balance-Leahy Scale: Poor                             ADL either performed or assessed with clinical judgement   ADL Overall ADL's : Needs assistance/impaired Eating/Feeding: Independent   Grooming: Wash/dry hands;Wash/dry face;Contact guard  assist;Standing   Upper Body Bathing: Set up;Supervision/ safety Upper Body Bathing Details (indicate cue type and reason): increased time to complete. pt with B shoulder AROM impairments. States that he dresses himslef at home without difficulty and that shoulders weren't bothering him unitl Monday of this week Lower Body Bathing: Moderate assistance   Upper Body Dressing : Set up;Supervision/safety   Lower Body Dressing: Moderate assistance   Toilet Transfer: Minimal assistance;Ambulation;Rolling walker (2 wheels);Cueing for safety   Toileting- Clothing Manipulation and Hygiene: Minimal assistance;Sit to/from stand       Functional mobility during ADLs: Minimal assistance;Rolling walker (2 wheels);Cueing for safety       Vision Ability to See in Adequate Light: 0 Adequate Patient Visual Report: No change from baseline       Perception         Praxis         Pertinent Vitals/Pain Pain Assessment Pain Assessment: No/denies pain Pain Location: L shoulder with ROM Pain Descriptors / Indicators: Aching, Discomfort, Grimacing, Guarding Pain Intervention(s): Monitored during session, Repositioned     Extremity/Trunk Assessment Upper Extremity Assessment Upper Extremity Assessment: Generalized weakness;Difficult to assess due to impaired cognition RUE Deficits / Details: limited shld AROM in all directions, able to flex elbow. Shldr flexion~75 degrees LUE Deficits / Details: limited AROM to shld in all directions, able to flex elbow. SHlr flexion ~30 degrees   Lower Extremity Assessment Lower Extremity Assessment: Defer to PT evaluation   Cervical / Trunk Assessment Cervical / Trunk Assessment: Normal   Communication Communication Communication: No apparent difficulties   Cognition Arousal: Alert Behavior During Therapy: WFL for tasks assessed/performed                                 Following commands: Intact       Cueing  General  Comments   Cueing Techniques: Verbal cues      Exercises Other Exercises Other Exercises: initated ROM exerices of B shoulders, instructing pt on assisting L UE with R UE, however pt states that he knows what exercises to do and that these aren't gonna work. B shoulders currently impaired and less than AROM considered WFL although pt states that he was Ind and had no diffulty at home/PLOF   Shoulder Instructions      Home Living Family/patient expects to be discharged to:: Private residence Living Arrangements: Alone Available Help at Discharge: Family;Available PRN/intermittently Type of Home: Apartment Home Access: Stairs to enter Entrance Stairs-Number of Steps: 4 Entrance Stairs-Rails: None Home Layout: One level     Bathroom Shower/Tub: Chief Strategy Officer: Standard     Home Equipment: Cane - single point          Prior Functioning/Environment Prior Level of Function : Independent/Modified Independent;Driving             Mobility Comments: uses cane ADLs Comments: Ind with ADLs, drives    OT Problem List: Decreased strength;Decreased knowledge of use of DME or AE;Decreased range of motion;Decreased activity tolerance;Pain;Impaired balance (sitting and/or standing);Decreased safety awareness   OT Treatment/Interventions: Self-care/ADL training;Patient/family  education;Therapeutic exercise;Balance training;Therapeutic activities;DME and/or AE instruction;Energy conservation      OT Goals(Current goals can be found in the care plan section)   Acute Rehab OT Goals Patient Stated Goal: go home OT Goal Formulation: With patient Time For Goal Achievement: 03/05/24 Potential to Achieve Goals: Good ADL Goals Pt Will Perform Grooming: with supervision;with set-up;standing Pt Will Perform Upper Body Bathing: with set-up;sitting Pt Will Perform Lower Body Bathing: with min assist;with contact guard assist;sit to/from stand Pt Will Perform Upper Body  Dressing: with set-up;sitting Pt Will Perform Lower Body Dressing: with min assist;with contact guard assist;sit to/from stand Pt Will Transfer to Toilet: with contact guard assist;with supervision;ambulating Pt Will Perform Toileting - Clothing Manipulation and hygiene: with contact guard assist;with supervision;sit to/from stand Pt/caregiver will Perform Home Exercise Program: Both right and left upper extremity;With written HEP provided Additional ADL Goal #1: Pt will verbalize and demo 3 energy conservation strategies for ADLs and ADL mobility   OT Frequency:  Min 2X/week    Co-evaluation              AM-PAC OT 6 Clicks Daily Activity     Outcome Measure Help from another person eating meals?: None Help from another person taking care of personal grooming?: A Little Help from another person toileting, which includes using toliet, bedpan, or urinal?: A Little Help from another person bathing (including washing, rinsing, drying)?: A Lot Help from another person to put on and taking off regular upper body clothing?: A Little Help from another person to put on and taking off regular lower body clothing?: A Lot 6 Click Score: 17   End of Session Equipment Utilized During Treatment: Gait belt;Rolling walker (2 wheels) Nurse Communication: Mobility status  Activity Tolerance: Patient limited by fatigue Patient left: in bed;with call bell/phone within reach  OT Visit Diagnosis: Other abnormalities of gait and mobility (R26.89);Unsteadiness on feet (R26.81);Muscle weakness (generalized) (M62.81)                Time: 8958-8894 OT Time Calculation (min): 24 min Charges:  OT General Charges $OT Visit: 1 Visit    Jacques Karna Loose 02/20/2024, 1:04 PM
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
## 2024-02-21 DIAGNOSIS — I5033 Acute on chronic diastolic (congestive) heart failure: Secondary | ICD-10-CM | POA: Diagnosis not present

## 2024-02-21 LAB — BASIC METABOLIC PANEL WITH GFR
Anion gap: 12 (ref 5–15)
BUN: 54 mg/dL — ABNORMAL HIGH (ref 8–23)
CO2: 18 mmol/L — ABNORMAL LOW (ref 22–32)
Calcium: 8.9 mg/dL (ref 8.9–10.3)
Chloride: 111 mmol/L (ref 98–111)
Creatinine, Ser: 5.91 mg/dL — ABNORMAL HIGH (ref 0.61–1.24)
GFR, Estimated: 9 mL/min — ABNORMAL LOW (ref >60)
Glucose, Bld: 115 mg/dL — ABNORMAL HIGH (ref 70–99)
Potassium: 4.1 mmol/L (ref 3.5–5.1)
Sodium: 141 mmol/L (ref 135–145)

## 2024-02-21 LAB — GLUCOSE, CAPILLARY
Glucose-Capillary: 135 mg/dL — ABNORMAL HIGH (ref 70–99)
Glucose-Capillary: 108 mg/dL — ABNORMAL HIGH (ref 70–99)
Glucose-Capillary: 119 mg/dL — ABNORMAL HIGH (ref 70–99)
Glucose-Capillary: 104 mg/dL — ABNORMAL HIGH (ref 70–99)

## 2024-02-21 MED ORDER — SENNOSIDES-DOCUSATE SODIUM 8.6-50 MG PO TABS
1.0000 | ORAL_TABLET | Freq: Two times a day (BID) | ORAL | Status: DC
  Administered 2024-02-21: 1 via ORAL
  Filled 2024-02-21 (×3): qty 1

## 2024-02-21 MED ORDER — LIDOCAINE 5 % EX PTCH
1.0000 | MEDICATED_PATCH | CUTANEOUS | Status: DC
Start: 2024-02-21 — End: 2024-02-22
  Administered 2024-02-21: 1 via TRANSDERMAL
  Filled 2024-02-21: qty 1

## 2024-02-21 MED ORDER — OXYCODONE HCL 5 MG PO TABS
10.0000 mg | ORAL_TABLET | Freq: Four times a day (QID) | ORAL | Status: AC | PRN
  Administered 2024-02-21 – 2024-02-22 (×4): 10 mg via ORAL
  Filled 2024-02-21 (×4): qty 2

## 2024-02-21 MED ORDER — POLYETHYLENE GLYCOL 3350 17 G PO PACK
17.0000 g | PACK | Freq: Every day | ORAL | Status: DC
Start: 2024-02-21 — End: 2024-02-22
  Administered 2024-02-22: 17 g via ORAL
  Filled 2024-02-21 (×2): qty 1

## 2024-02-21 MED ORDER — METHOCARBAMOL 500 MG PO TABS
500.0000 mg | ORAL_TABLET | Freq: Three times a day (TID) | ORAL | Status: DC
  Administered 2024-02-21 – 2024-02-22 (×4): 500 mg via ORAL
  Filled 2024-02-21 (×4): qty 1

## 2024-02-21 MED ORDER — DICLOFENAC SODIUM 1 % EX GEL
2.0000 g | Freq: Four times a day (QID) | CUTANEOUS | Status: DC
  Filled 2024-02-21: qty 100

## 2024-02-21 NOTE — Progress Notes (Signed)
 PT Cancellation Note  Patient Details Name: Sladen Plancarte MRN: [id number] DOB: Jul 20, 1950   Cancelled Treatment:    Reason Eval/Treat Not Completed: Patient declined, no reason specified.  Pt reported that his L shoulder/arm was too painful to get up, but thanks. 02/21/2024  [place] HERO., PT Acute Rehabilitation Services [phone]  (office)   Vinie GAILS Waleed Dettman 02/21/2024, 6:14 PM

## 2024-02-21 NOTE — Plan of Care (Signed)
  Problem: Health Behavior/Discharge Planning: Goal: Ability to manage health-related needs will improve Outcome: Progressing   Problem: Clinical Measurements: Goal: Respiratory complications will improve Outcome: Progressing Goal: Cardiovascular complication will be avoided Outcome: Progressing   Problem: Activity: Goal: Risk for activity intolerance will decrease Outcome: Progressing   Problem: Nutrition: Goal: Adequate nutrition will be maintained Outcome: Progressing   Problem: Coping: Goal: Level of anxiety will decrease Outcome: Progressing   Problem: Pain Managment: Goal: General experience of comfort will improve and/or be controlled Outcome: Progressing   Problem: Safety: Goal: Ability to remain free from injury will improve Outcome: Progressing

## 2024-02-21 NOTE — Progress Notes (Signed)
 PROGRESS NOTE    Samuel [name]  FMW:[id number] DOB: 1950/08/09 DOA: 02/18/2024 PCP: [name], MD  73/M w CKD 5 with AV graft in place, not on HD yet, chronic diastolic CHF, hypertension, ischemic CVA with residual left-sided paresis, chronic diastolic heart failure, anemia of chronic kidney disease presented with L shoulder pain and dyspnea on exertion. H/o chronic severe degenerative arthritis involving the left shoulder. Over the last 3 to 4 days, patient notes some worsening discomfort associated with the left shoulder, worse with movement of the left shoulder. In ED, hypertensive, K 4.5, creatinine 5.32, BNP 2961, troponin 152, CXR w/ diffuse bilateral interstitial opacities consistent with interstitial edema. Xray Shoulder: Unchanged severe degen changes at the glenohumeral and acromioclavicular joints.  Subjective: Wants to go home, reports that he is breathing better, shoulder is better  Assessment and Plan:  Acute on chronic diastolic CHF -last echo 2/22: with EF 65%, grade 1DD - Repeat echo with EF of 55-60, grade 2 DD, normal RV -will d/w Nephrology, improved with diuresis, he is 4 L negative, appears close to euvolemic now, creatinine slightly higher than baseline, hold further diuretics, resume torsemide tomorrow if creatinine is improving  AKi on CKD5 -baseline creat 4, followed [facility] Dr.Nwobu Sp L arm AVF on 12/09/23 -monitor urine output, BMP in am, see discussion above, holding diuretics today  L shouder Pain -chronic severe degenerative arthritis of the left shoulder on imaging PT eval, Robaxin , lidocaine  patch Would recommend FU witho   Essential Hypertension: -continue Norvasc , hydralazine ,holding lisinopril   DM 2 -HbA1c is 4.2 -SSI for now  Anemia of chronic dz -monitor  DVT prophylaxis: lovenix Code Status: Full Code Family Communication: none Disposition Plan: Home tomorrow if creatinine plateau/improving  Consultants:     Procedures:   Antimicrobials:    Objective: Vitals:   02/21/24 0023 02/21/24 0440 02/21/24 0505 02/21/24 0806  BP: (!) 191/101 (!) 181/97 (!) 181/97 (!) 162/94  Pulse:  64    Resp:  18  18  Temp:  98.1 F (36.7 C)  97.6 F (36.4 C)  TempSrc:  Oral  Oral  SpO2:  97%    Weight:  72.6 kg    Height:        Intake/Output Summary (Last 24 hours) at 02/21/2024 1213 Last data filed at 02/21/2024 1042 Gross per 24 hour  Intake --  Output 1915 ml  Net -1915 ml   Filed Weights   02/19/24 0445 02/20/24 0317 02/21/24 0440  Weight: 79.2 kg 74.8 kg 72.6 kg    Examination:  General exam: Appears calm and comfortable  HEENT: no JVD Respiratory system: Clear to auscultation Cardiovascular system: S1 & S2 heard, RRR.  Abd: nondistended, soft and nontender.Normal bowel sounds heard. Central nervous system: Alert and oriented. No focal neurological deficits. Extremities: trace edema Skin: No rashes Psychiatry:  Mood & affect appropriate.     Data Reviewed:   CBC: Recent Labs  Lab 02/18/24 1408 02/19/24 0744  WBC 4.0 6.4  NEUTROABS 1.8 2.5  HGB 9.5* 9.9*  HCT 29.9* 30.9*  MCV 96.8 96.0  PLT 163 169   Basic Metabolic Panel: Recent Labs  Lab 02/18/24 1408 02/19/24 0744 02/20/24 0345 02/21/24 0430  NA 144 143 141 141  K 4.5 4.4 4.2 4.1  CL 115* 120* 113* 111  CO2 18* 16* 16* 18*  GLUCOSE 101* 89 87 115*  BUN 53* 57* 57* 54*  CREATININE 5.32* 5.63* 5.82* 5.91*  CALCIUM 8.8* 8.7* 8.8* 8.9  MG  --  2.0  --   --   PHOS  --  4.0  --   --    GFR: Estimated Creatinine Clearance: 11.6 mL/min (A) (by C-G formula based on SCr of 5.91 mg/dL (H)). Liver Function Tests: Recent Labs  Lab 02/18/24 1408 02/19/24 0744  AST 24 21  ALT 16 15  ALKPHOS 55 49  BILITOT 0.6 1.1  PROT 6.5 6.1*  ALBUMIN 3.5 3.0*   No results for input(s): LIPASE, AMYLASE in the last 168 hours. No results for input(s): AMMONIA in the last 168 hours. Coagulation Profile: Recent  Labs  Lab 02/19/24 0744  INR 1.2   Cardiac Enzymes: No results for input(s): CKTOTAL, CKMB, CKMBINDEX, TROPONINI in the last 168 hours. BNP (last 3 results) Recent Labs    02/18/24 1408  PROBNP 13,961.0*   HbA1C: Recent Labs    02/19/24 0744  HGBA1C 4.2*   CBG: Recent Labs  Lab 02/20/24 0829 02/20/24 1208 02/20/24 1638 02/20/24 2118 02/21/24 0812  GLUCAP 125* 90 89 80 135*   Lipid Profile: No results for input(s): CHOL, HDL, LDLCALC, TRIG, CHOLHDL, LDLDIRECT in the last 72 hours. Thyroid Function Tests: No results for input(s): TSH, T4TOTAL, FREET4, T3FREE, THYROIDAB in the last 72 hours. Anemia Panel: No results for input(s): VITAMINB12, FOLATE, FERRITIN, TIBC, IRON, RETICCTPCT in the last 72 hours. Urine analysis:    Component Value Date/Time   COLORURINE YELLOW 07/16/2020 0008   APPEARANCEUR CLEAR 07/16/2020 0008   LABSPEC 1.015 07/16/2020 0008   PHURINE 8.0 07/16/2020 0008   GLUCOSEU NEGATIVE 07/16/2020 0008   HGBUR NEGATIVE 07/16/2020 0008   BILIRUBINUR NEGATIVE 07/16/2020 0008   KETONESUR NEGATIVE 07/16/2020 0008   PROTEINUR NEGATIVE 07/16/2020 0008   NITRITE NEGATIVE 07/16/2020 0008   LEUKOCYTESUR NEGATIVE 07/16/2020 0008   Sepsis Labs: @LABRCNTIP (procalcitonin:4,lacticidven:4)  )No results found for this or any previous visit (from the past 240 hours).   Radiology Studies: ECHOCARDIOGRAM COMPLETE Result Date: 02/19/2024    ECHOCARDIOGRAM REPORT   Patient Name:   Samuel [name] Date of Exam: 02/19/2024 Medical Rec #:  [id number]       Height:       77.0 in Accession #:    [id number]      Weight:       174.5 lb Date of Birth:  July 08, 1950       BSA:          2.111 m Patient Age:    73 years        BP:           157/81 mmHg Patient Gender: M               HR:           60 bpm. Exam Location:  Inpatient Procedure: 2D Echo, Cardiac Doppler and Color Doppler (Both Spectral and Color            Flow Doppler were  utilized during procedure). Indications:    CHF  History:        Patient has prior history of Echocardiogram examinations, most                 recent 07/16/2020. Signs/Symptoms:Shortness of Breath; Risk                 Factors:Hypertension and Diabetes. CKD.  Sonographer:    Philomena Daring Referring Phys: [id number] JUSTIN B HOWERTER IMPRESSIONS  1. Left ventricular ejection fraction, by estimation, is 55 to 60%. The left ventricle has normal function. The  left ventricle has no regional wall motion abnormalities. There is severe concentric left ventricular hypertrophy. Left ventricular diastolic  parameters are consistent with Grade II diastolic dysfunction (pseudonormalization).  2. Right ventricular systolic function is normal. The right ventricular size is normal.  3. Left atrial size was severely dilated.  4. Moderate pleural effusion in the left lateral region.  5. The mitral valve is normal in structure. Mild mitral valve regurgitation. No evidence of mitral stenosis.  6. The aortic valve is tricuspid. Aortic valve regurgitation is mild. Aortic valve sclerosis is present, with no evidence of aortic valve stenosis.  7. Aortic dilatation noted. There is mild dilatation of the ascending aorta, measuring 40 mm.  8. The inferior vena cava is dilated in size with >50% respiratory variability, suggesting right atrial pressure of 8 mmHg. FINDINGS  Left Ventricle: Left ventricular ejection fraction, by estimation, is 55 to 60%. The left ventricle has normal function. The left ventricle has no regional wall motion abnormalities. The left ventricular internal cavity size was normal in size. There is  severe concentric left ventricular hypertrophy. Left ventricular diastolic parameters are consistent with Grade II diastolic dysfunction (pseudonormalization). Right Ventricle: The right ventricular size is normal. Right ventricular systolic function is normal. Left Atrium: Left atrial size was severely dilated. Right Atrium:  Right atrial size was normal in size. Pericardium: Trivial pericardial effusion is present. Mitral Valve: The mitral valve is normal in structure. Mild mitral valve regurgitation. No evidence of mitral valve stenosis. Tricuspid Valve: The tricuspid valve is normal in structure. Tricuspid valve regurgitation is mild . No evidence of tricuspid stenosis. Aortic Valve: The aortic valve is tricuspid. Aortic valve regurgitation is mild. Aortic valve sclerosis is present, with no evidence of aortic valve stenosis. Pulmonic Valve: The pulmonic valve was normal in structure. Pulmonic valve regurgitation is not visualized. No evidence of pulmonic stenosis. Aorta: Aortic dilatation noted. There is mild dilatation of the ascending aorta, measuring 40 mm. Venous: The inferior vena cava is dilated in size with greater than 50% respiratory variability, suggesting right atrial pressure of 8 mmHg. IAS/Shunts: No atrial level shunt detected by color flow Doppler. Additional Comments: There is a moderate pleural effusion in the left lateral region.  LEFT VENTRICLE PLAX 2D LVIDd:         4.50 cm   Diastology LVIDs:         2.90 cm   LV e' medial:    5.44 cm/s LV PW:         1.40 cm   LV E/e' medial:  10.4 LV IVS:        1.60 cm   LV e' lateral:   8.05 cm/s LVOT diam:     2.40 cm   LV E/e' lateral: 7.0 LV SV:         116 LV SV Index:   55 LVOT Area:     4.52 cm  RIGHT VENTRICLE             IVC RV S prime:     14.00 cm/s  IVC diam: 2.80 cm TAPSE (M-mode): 2.2 cm LEFT ATRIUM              Index        RIGHT ATRIUM           Index LA diam:        4.30 cm  2.04 cm/m   RA Area:     20.80 cm LA Vol (A2C):   103.0 ml 48.80 ml/m  RA Volume:   58.80 ml  27.86 ml/m LA Vol (A4C):   87.3 ml  41.36 ml/m LA Biplane Vol: 104.0 ml 49.27 ml/m  AORTIC VALVE LVOT Vmax:   117.00 cm/s LVOT Vmean:  73.900 cm/s LVOT VTI:    0.256 m  AORTA Ao Root diam: 3.80 cm Ao Asc diam:  4.00 cm MITRAL VALVE MV Area (PHT): 3.39 cm    SHUNTS MV Decel Time: 224 msec     Systemic VTI:  0.26 m MV E velocity: 56.60 cm/s  Systemic Diam: 2.40 cm MV A velocity: 57.10 cm/s MV E/A ratio:  0.99 Redell Shallow MD Electronically signed by Redell Shallow MD Signature Date/Time: 02/19/2024/3:36:26 PM    Final      Scheduled Meds:  amLODipine   10 mg Oral Daily   aspirin   325 mg Oral Daily   hydrALAZINE   100 mg Oral Q8H   insulin  aspart  0-6 Units Subcutaneous TID WC   methocarbamol   500 mg Oral TID   polyethylene glycol  17 g Oral Daily   senna-docusate  1 tablet Oral BID   sodium bicarbonate   650 mg Oral BID   Continuous Infusions:   LOS: 2 days    Time spent:    Sigurd Pac, MD [facility]   02/21/2024, 12:13 PM

## 2024-02-21 NOTE — TOC Initial Note (Addendum)
 Transition of Care [facility]) - Initial/Assessment Note    Patient Details  Name: Samuel [name] MRN: [id number] Date of Birth: 1950/05/30  [facility]) CM/SW Contact:    Sudie Erminio Deems, RN Phone Number: 02/21/2024, 3:01 PM  Clinical Narrative: Patient presented for shortness of breath. Patient has PCP and he states he has transportation to appointments. PTA patient was from home alone. Patient states he has support of grandchildren that checks in on him. Patient is working with his PCP for personal care aide. Physical Therapy has worked with the patient and recommendations are for [facility] PT/OT and DME rolling walker, bedside commode, and shower chair. Patient is agreeable to home health services and he has no agency preference for [facility] or DME.  HH referral submitted to [facility] and DME order submitted to [facility] for delivery. No further needs identified by Inpatient Case Manager.                 1538 Patient declined the RW states he had one in the home. All other DME received.   Expected Discharge Plan: Home w Home Health Services Barriers to Discharge: No Barriers Identified   Patient Goals and CMS Choice Patient states their goals for this hospitalization and ongoing recovery are:: plan to return home with home health services.   Choice offered to / list presented to : Patient (Patient has no agency preference.)      Expected Discharge Plan and Services In-house Referral: NA Discharge Planning Services: CM Consult Post Acute Care Choice: Home Health, Durable Medical Equipment Living arrangements for the past 2 months: Apartment                 DME Arranged: Walker rolling, Bedside commode, Shower stool DME Agency: [facility], [employer] Date DME Agency Contacted: 02/21/24 Time DME Agency Contacted: 1500 Representative spoke with at DME Agency: [place] HH Arranged: PT, OT HH Agency: [facility]) Date [facility] Agency  Contacted: 02/21/24 Time HH Agency Contacted: 1445 Representative spoke with at [facility] Agency: [place]  Prior Living Arrangements/Services Living arrangements for the past 2 months: Apartment Lives with:: Self (has support of grandchildren.) Patient language and need for interpreter reviewed:: Yes Do you feel safe going back to the place where you live?: Yes      Need for Family Participation in Patient Care: No (Comment) Care giver support system in place?: No (comment)   Criminal Activity/Legal Involvement Pertinent to Current Situation/Hospitalization: No - Comment as needed  Activities of Daily Living   ADL Screening (condition at time of admission) Independently performs ADLs?: Yes (appropriate for developmental age) Is the patient deaf or have difficulty hearing?: No Does the patient have difficulty seeing, even when wearing glasses/contacts?: No Does the patient have difficulty concentrating, remembering, or making decisions?: No  Permission Sought/Granted Permission sought to share information with : Family Supports, Magazine features editor, Case Estate manager/land agent granted to share information with : Yes, Verbal Permission Granted              Emotional Assessment Appearance:: Appears stated age Attitude/Demeanor/Rapport: Engaged Affect (typically observed): Appropriate Orientation: : Oriented to Self, Oriented to Place Alcohol / Substance Use: Not Applicable Psych Involvement: No (comment)  Admission diagnosis:  Acute decompensated heart failure (HCC) [I50.9] Acute pain of left shoulder [M25.512] Acute on chronic diastolic (congestive) heart failure (HCC) [I50.33] Chronic kidney disease, unspecified CKD stage [N18.9] Patient Active Problem List   Diagnosis Date Noted   Acute on chronic  diastolic (congestive) heart failure (HCC) 02/18/2024   SOB (shortness of breath) 02/18/2024   Elevated troponin 02/18/2024   Chronic left shoulder pain 02/18/2024   CKD  (chronic kidney disease) stage 5, GFR less than 15 ml/min (HCC) 02/18/2024   Essential hypertension 02/18/2024   DM2 (diabetes mellitus, type 2) (HCC) 02/18/2024   History of anemia due to chronic kidney disease 02/18/2024   ICH (intracerebral hemorrhage) (HCC) 07/16/2020   Hypertensive crisis    CKD (chronic kidney disease), stage IV (HCC)    PCP:  [name], MD Pharmacy:   [facility] [phone] - [place], [place] - [address] [place] [address] Phone: [phone] Fax: [phone]     Social Drivers of Health (SDOH) Social History: SDOH Screenings   Food Insecurity: No Food Insecurity (02/18/2024)  Housing: Low Risk  (02/18/2024)  Transportation Needs: No Transportation Needs (02/18/2024)  Utilities: Not At Risk (02/18/2024)  Depression (PHQ2-9): Low Risk  (09/06/2020)  Social Connections: Socially Isolated (02/18/2024)  Tobacco Use: High Risk (02/18/2024)   SDOH Interventions:     Readmission Risk Interventions     No data to display

## 2024-02-21 NOTE — Progress Notes (Signed)
 Contacted MD to come see patient about the 10/10 shoulder pain he has been in. Gave fentanyl  and tylenol . Will soon give oxycodone  but nothing is easing the pain.

## 2024-02-21 NOTE — Plan of Care (Signed)
  Problem: Education: Goal: Knowledge of General Education information will improve Description: Including pain rating scale, medication(s)/side effects and non-pharmacologic comfort measures Outcome: Progressing   Problem: Health Behavior/Discharge Planning: Goal: Ability to manage health-related needs will improve Outcome: Progressing   Problem: Clinical Measurements: Goal: Ability to maintain clinical measurements within normal limits will improve Outcome: Progressing Goal: Will remain free from infection Outcome: Progressing Goal: Diagnostic test results will improve Outcome: Progressing Goal: Respiratory complications will improve Outcome: Progressing Goal: Cardiovascular complication will be avoided Outcome: Progressing   Problem: Activity: Goal: Risk for activity intolerance will decrease Outcome: Progressing   Problem: Nutrition: Goal: Adequate nutrition will be maintained Outcome: Progressing   Problem: Coping: Goal: Level of anxiety will decrease Outcome: Progressing   Problem: Elimination: Goal: Will not experience complications related to bowel motility Outcome: Progressing Goal: Will not experience complications related to urinary retention Outcome: Progressing   Problem: Pain Managment: Goal: General experience of comfort will improve and/or be controlled Outcome: Progressing   Problem: Safety: Goal: Ability to remain free from injury will improve Outcome: Progressing   Problem: Skin Integrity: Goal: Risk for impaired skin integrity will decrease Outcome: Progressing   Problem: Education: Goal: Understanding of cardiac disease, CV risk reduction, and recovery process will improve Outcome: Progressing Goal: Individualized Educational Video(s) Outcome: Progressing   Problem: Activity: Goal: Ability to tolerate increased activity will improve Outcome: Progressing   Problem: Cardiac: Goal: Ability to achieve and maintain adequate cardiovascular  perfusion will improve Outcome: Progressing   Problem: Health Behavior/Discharge Planning: Goal: Ability to safely manage health-related needs after discharge will improve Outcome: Progressing   Problem: Education: Goal: Ability to describe self-care measures that may prevent or decrease complications (Diabetes Survival Skills Education) will improve Outcome: Progressing Goal: Individualized Educational Video(s) Outcome: Progressing   Problem: Coping: Goal: Ability to adjust to condition or change in health will improve Outcome: Progressing   Problem: Fluid Volume: Goal: Ability to maintain a balanced intake and output will improve Outcome: Progressing   Problem: Health Behavior/Discharge Planning: Goal: Ability to identify and utilize available resources and services will improve Outcome: Progressing Goal: Ability to manage health-related needs will improve Outcome: Progressing   Problem: Metabolic: Goal: Ability to maintain appropriate glucose levels will improve Outcome: Progressing   Problem: Nutritional: Goal: Maintenance of adequate nutrition will improve Outcome: Progressing Goal: Progress toward achieving an optimal weight will improve Outcome: Progressing   Problem: Skin Integrity: Goal: Risk for impaired skin integrity will decrease Outcome: Progressing   Problem: Tissue Perfusion: Goal: Adequacy of tissue perfusion will improve Outcome: Progressing

## 2024-02-22 ENCOUNTER — Other Ambulatory Visit (HOSPITAL_COMMUNITY): Payer: Self-pay

## 2024-02-22 DIAGNOSIS — I5033 Acute on chronic diastolic (congestive) heart failure: Secondary | ICD-10-CM | POA: Diagnosis not present

## 2024-02-22 LAB — BASIC METABOLIC PANEL WITH GFR
Anion gap: 12 (ref 5–15)
BUN: 55 mg/dL — ABNORMAL HIGH (ref 8–23)
CO2: 18 mmol/L — ABNORMAL LOW (ref 22–32)
Calcium: 9.1 mg/dL (ref 8.9–10.3)
Chloride: 111 mmol/L (ref 98–111)
Creatinine, Ser: 6.17 mg/dL — ABNORMAL HIGH (ref 0.61–1.24)
GFR, Estimated: 9 mL/min — ABNORMAL LOW (ref >60)
Glucose, Bld: 100 mg/dL — ABNORMAL HIGH (ref 70–99)
Potassium: 4.9 mmol/L (ref 3.5–5.1)
Sodium: 141 mmol/L (ref 135–145)

## 2024-02-22 LAB — GLUCOSE, CAPILLARY: Glucose-Capillary: 116 mg/dL — ABNORMAL HIGH (ref 70–99)

## 2024-02-22 MED ORDER — CYCLOBENZAPRINE HCL 5 MG PO TABS
5.0000 mg | ORAL_TABLET | Freq: Two times a day (BID) | ORAL | 0 refills | Status: AC
  Filled 2024-02-22: qty 45, 23d supply, fill #0

## 2024-02-22 MED ORDER — LIDOCAINE 5 % EX PTCH
1.0000 | MEDICATED_PATCH | CUTANEOUS | 0 refills | Status: AC
  Filled 2024-02-22: qty 30, 30d supply, fill #0

## 2024-02-22 MED ORDER — ISOSORBIDE MONONITRATE ER 30 MG PO TB24
30.0000 mg | ORAL_TABLET | Freq: Every day | ORAL | Status: DC
  Administered 2024-02-22: 30 mg via ORAL
  Filled 2024-02-22: qty 1

## 2024-02-22 MED ORDER — SODIUM BICARBONATE 650 MG PO TABS
650.0000 mg | ORAL_TABLET | Freq: Two times a day (BID) | ORAL | 0 refills | Status: AC
  Filled 2024-02-22: qty 60, 30d supply, fill #0

## 2024-02-22 MED ORDER — FUROSEMIDE 40 MG PO TABS
40.0000 mg | ORAL_TABLET | Freq: Every day | ORAL | 0 refills | Status: AC
  Filled 2024-02-22: qty 30, 30d supply, fill #0

## 2024-02-22 NOTE — Progress Notes (Signed)
 PROGRESS NOTE    Samuel [name]  FMW:[id number] DOB: 1950-10-31 DOA: 02/18/2024 PCP: [name], MD  73/M w CKD 5 with AV graft in place, not on HD yet, chronic diastolic CHF, hypertension, ischemic CVA with residual left-sided paresis, chronic diastolic heart failure, anemia of chronic kidney disease presented with L shoulder pain and dyspnea on exertion. H/o chronic severe degenerative arthritis involving the left shoulder. Over the last 3 to 4 days, patient notes some worsening discomfort associated with the left shoulder, worse with movement of the left shoulder. In ED, hypertensive, K 4.5, creatinine 5.32, BNP 2961, troponin 152, CXR w/ diffuse bilateral interstitial opacities consistent with interstitial edema. Xray Shoulder: Unchanged severe degen changes at the glenohumeral and acromioclavicular joints.  Subjective: - Feels okay overall, no events overnight, breathing improved, asking to stay 1 more day, still with shoulder pain  Assessment and Plan:  Acute on chronic diastolic CHF -last echo 2/22: with EF 65%, grade 1DD - Repeat echo with EF of 55-60, grade 2 DD, normal RV - Improved with diuresis, he is 6 L negative, remains euvolemic, diuretics have been held since yesterday mild uptrend in creatinine, GFR stable, no uremic symptoms and good urine output - Discussed with nephrology, recommended close follow-up with outpatient nephrologist at discharge - I called Dr.Nwobu's office yesterday to request close follow-up  AKi on CKD5 -baseline creat 4, followed [facility] Dr.Nwobu Sp L arm AVF on 12/09/23 -monitor urine output, BMP in am, see discussion above, holding diuretics   L shouder Pain -chronic severe degenerative arthritis of the left shoulder on imaging PT eval, Robaxin , lidocaine  patch Would recommend FU witho   Essential Hypertension: -continue Norvasc , hydralazine ,holding lisinopril   DM 2 -HbA1c is 4.2 -SSI for now  Anemia of chronic  dz -monitor  DVT prophylaxis: lovenix Code Status: Full Code Family Communication: none Disposition Plan: Home tomorrow if stable  Consultants:    Procedures:   Antimicrobials:    Objective: Vitals:   02/22/24 0456 02/22/24 0500 02/22/24 0748 02/22/24 1012  BP: (!) 169/113   (!) 174/105  Pulse: (!) 102   75  Resp: 20  (!) 21 18  Temp: 98 F (36.7 C)  97.7 F (36.5 C)   TempSrc: Oral  Oral   SpO2: 96%   97%  Weight:  71.8 kg    Height:        Intake/Output Summary (Last 24 hours) at 02/22/2024 1134 Last data filed at 02/22/2024 0457 Gross per 24 hour  Intake --  Output 650 ml  Net -650 ml   Filed Weights   02/20/24 0317 02/21/24 0440 02/22/24 0500  Weight: 74.8 kg 72.6 kg 71.8 kg    Examination:  General exam: Appears calm and comfortable  HEENT: no JVD Respiratory system: Clear to auscultation Cardiovascular system: S1 & S2 heard, RRR.  Abd: nondistended, soft and nontender.Normal bowel sounds heard. Central nervous system: Alert and oriented. No focal neurological deficits. Extremities: trace edema Skin: No rashes Psychiatry:  Mood & affect appropriate.     Data Reviewed:   CBC: Recent Labs  Lab 02/18/24 1408 02/19/24 0744  WBC 4.0 6.4  NEUTROABS 1.8 2.5  HGB 9.5* 9.9*  HCT 29.9* 30.9*  MCV 96.8 96.0  PLT 163 169   Basic Metabolic Panel: Recent Labs  Lab 02/18/24 1408 02/19/24 0744 02/20/24 0345 02/21/24 0430 02/22/24 0440  NA 144 143 141 141 141  K 4.5 4.4 4.2 4.1 4.9  CL 115* 120* 113* 111 111  CO2 18* 16*  16* 18* 18*  GLUCOSE 101* 89 87 115* 100*  BUN 53* 57* 57* 54* 55*  CREATININE 5.32* 5.63* 5.82* 5.91* 6.17*  CALCIUM 8.8* 8.7* 8.8* 8.9 9.1  MG  --  2.0  --   --   --   PHOS  --  4.0  --   --   --    GFR: Estimated Creatinine Clearance: 11 mL/min (A) (by C-G formula based on SCr of 6.17 mg/dL (H)). Liver Function Tests: Recent Labs  Lab 02/18/24 1408 02/19/24 0744  AST 24 21  ALT 16 15  ALKPHOS 55 49  BILITOT 0.6  1.1  PROT 6.5 6.1*  ALBUMIN 3.5 3.0*   No results for input(s): LIPASE, AMYLASE in the last 168 hours. No results for input(s): AMMONIA in the last 168 hours. Coagulation Profile: Recent Labs  Lab 02/19/24 0744  INR 1.2   Cardiac Enzymes: No results for input(s): CKTOTAL, CKMB, CKMBINDEX, TROPONINI in the last 168 hours. BNP (last 3 results) Recent Labs    02/18/24 1408  PROBNP 13,961.0*   HbA1C: No results for input(s): HGBA1C in the last 72 hours.  CBG: Recent Labs  Lab 02/21/24 0812 02/21/24 1225 02/21/24 1636 02/21/24 2102 02/22/24 0747  GLUCAP 135* 108* 119* 104* 116*   Lipid Profile: No results for input(s): CHOL, HDL, LDLCALC, TRIG, CHOLHDL, LDLDIRECT in the last 72 hours. Thyroid Function Tests: No results for input(s): TSH, T4TOTAL, FREET4, T3FREE, THYROIDAB in the last 72 hours. Anemia Panel: No results for input(s): VITAMINB12, FOLATE, FERRITIN, TIBC, IRON, RETICCTPCT in the last 72 hours. Urine analysis:    Component Value Date/Time   COLORURINE YELLOW 07/16/2020 0008   APPEARANCEUR CLEAR 07/16/2020 0008   LABSPEC 1.015 07/16/2020 0008   PHURINE 8.0 07/16/2020 0008   GLUCOSEU NEGATIVE 07/16/2020 0008   HGBUR NEGATIVE 07/16/2020 0008   BILIRUBINUR NEGATIVE 07/16/2020 0008   KETONESUR NEGATIVE 07/16/2020 0008   PROTEINUR NEGATIVE 07/16/2020 0008   NITRITE NEGATIVE 07/16/2020 0008   LEUKOCYTESUR NEGATIVE 07/16/2020 0008   Sepsis Labs: @LABRCNTIP (procalcitonin:4,lacticidven:4)  )No results found for this or any previous visit (from the past 240 hours).   Radiology Studies: No results found.    Scheduled Meds:  amLODipine   10 mg Oral Daily   aspirin   325 mg Oral Daily   hydrALAZINE   100 mg Oral Q8H   insulin  aspart  0-6 Units Subcutaneous TID WC   isosorbide mononitrate  30 mg Oral Daily   lidocaine   1 patch Transdermal Q24H   methocarbamol   500 mg Oral TID   polyethylene glycol  17 g Oral  Daily   senna-docusate  1 tablet Oral BID   sodium bicarbonate   650 mg Oral BID   Continuous Infusions:   LOS: 3 days    Time spent:    Sigurd Pac, MD [facility]   02/22/2024, 11:34 AM

## 2024-02-22 NOTE — Plan of Care (Signed)
   Problem: Activity: Goal: Risk for activity intolerance will decrease Outcome: Progressing   Problem: Coping: Goal: Level of anxiety will decrease Outcome: Progressing
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
